# Patient Record
Sex: Female | Born: 1970
Health system: Southern US, Community
[De-identification: ages and names within clinical notes are randomized; demographics above are authoritative.]

## PROBLEM LIST (undated history)

## (undated) DIAGNOSIS — I10 Essential (primary) hypertension: Secondary | ICD-10-CM

## (undated) HISTORY — PX: TUBAL LIGATION: SHX77

---

## 2008-08-02 ENCOUNTER — Emergency Department (HOSPITAL_COMMUNITY): Admission: EM | Admit: 2008-08-02 | Discharge: 2008-08-02 | Payer: Self-pay | Admitting: Family Medicine

## 2008-08-25 ENCOUNTER — Encounter: Admission: RE | Admit: 2008-08-25 | Discharge: 2008-08-25 | Payer: Self-pay | Admitting: Family Medicine

## 2008-11-20 ENCOUNTER — Ambulatory Visit: Payer: Self-pay | Admitting: Radiology

## 2008-11-20 ENCOUNTER — Emergency Department (HOSPITAL_BASED_OUTPATIENT_CLINIC_OR_DEPARTMENT_OTHER): Admission: EM | Admit: 2008-11-20 | Discharge: 2008-11-20 | Payer: Self-pay | Admitting: Emergency Medicine

## 2009-08-17 ENCOUNTER — Emergency Department (HOSPITAL_BASED_OUTPATIENT_CLINIC_OR_DEPARTMENT_OTHER): Admission: EM | Admit: 2009-08-17 | Discharge: 2009-08-17 | Payer: Self-pay | Admitting: Emergency Medicine

## 2009-08-17 ENCOUNTER — Ambulatory Visit: Payer: Self-pay | Admitting: Diagnostic Radiology

## 2010-06-11 LAB — URINALYSIS, ROUTINE W REFLEX MICROSCOPIC
Bilirubin Urine: NEGATIVE
Protein, ur: NEGATIVE mg/dL
Specific Gravity, Urine: 1.021 (ref 1.005–1.030)
pH: 8 (ref 5.0–8.0)

## 2010-06-11 LAB — BASIC METABOLIC PANEL
CO2: 27 mEq/L (ref 19–32)
Calcium: 8.9 mg/dL (ref 8.4–10.5)
Chloride: 106 mEq/L (ref 96–112)
Creatinine, Ser: 0.7 mg/dL (ref 0.4–1.2)
Glucose, Bld: 98 mg/dL (ref 70–99)

## 2010-06-11 LAB — DIFFERENTIAL
Basophils Absolute: 0.1 10*3/uL (ref 0.0–0.1)
Basophils Relative: 1 % (ref 0–1)
Eosinophils Absolute: 0 10*3/uL (ref 0.0–0.7)
Eosinophils Relative: 1 % (ref 0–5)
Lymphs Abs: 1.2 10*3/uL (ref 0.7–4.0)
Monocytes Absolute: 0.4 10*3/uL (ref 0.1–1.0)
Monocytes Relative: 5 % (ref 3–12)

## 2010-06-11 LAB — CBC
Hemoglobin: 11.5 g/dL — ABNORMAL LOW (ref 12.0–15.0)
MCHC: 34.2 g/dL (ref 30.0–36.0)
RBC: 3.92 MIL/uL (ref 3.87–5.11)
WBC: 7.9 10*3/uL (ref 4.0–10.5)

## 2010-06-11 LAB — WET PREP, GENITAL
Trich, Wet Prep: NONE SEEN
Yeast Wet Prep HPF POC: NONE SEEN

## 2010-06-11 LAB — PREGNANCY, URINE: Preg Test, Ur: NEGATIVE

## 2010-06-30 LAB — COMPREHENSIVE METABOLIC PANEL
ALT: 6 U/L (ref 0–35)
AST: 26 U/L (ref 0–37)
Albumin: 4.2 g/dL (ref 3.5–5.2)
Alkaline Phosphatase: 79 U/L (ref 39–117)
BUN: 8 mg/dL (ref 6–23)
CO2: 32 mEq/L (ref 19–32)
Creatinine, Ser: 0.7 mg/dL (ref 0.4–1.2)
GFR calc non Af Amer: >60 mL/min (ref >60)
Glucose, Bld: 94 mg/dL (ref 70–99)
Total Bilirubin: 0.2 mg/dL — ABNORMAL LOW (ref 0.3–1.2)
Total Protein: 7.9 g/dL (ref 6.0–8.3)

## 2010-06-30 LAB — WET PREP, GENITAL

## 2010-06-30 LAB — URINALYSIS, ROUTINE W REFLEX MICROSCOPIC
Glucose, UA: NEGATIVE mg/dL
Ketones, ur: NEGATIVE mg/dL
Nitrite: NEGATIVE
Protein, ur: NEGATIVE mg/dL
Urobilinogen, UA: 0.2 mg/dL (ref 0.0–1.0)
pH: 7 (ref 5.0–8.0)

## 2010-06-30 LAB — CBC
MCHC: 33.7 g/dL (ref 30.0–36.0)
Platelets: 364 10*3/uL (ref 150–400)
WBC: 6 10*3/uL (ref 4.0–10.5)

## 2010-06-30 LAB — GC/CHLAMYDIA PROBE AMP, GENITAL: Chlamydia, DNA Probe: NEGATIVE

## 2010-06-30 LAB — DIFFERENTIAL
Eosinophils Absolute: 0.1 10*3/uL (ref 0.0–0.7)
Eosinophils Relative: 1 % (ref 0–5)
Lymphocytes Relative: 29 % (ref 12–46)
Monocytes Relative: 6 % (ref 3–12)

## 2010-06-30 LAB — PREGNANCY, URINE: Preg Test, Ur: NEGATIVE

## 2010-07-21 ENCOUNTER — Emergency Department (HOSPITAL_COMMUNITY)
Admission: EM | Admit: 2010-07-21 | Discharge: 2010-07-21 | Disposition: A | Discharge status: Home / Self Care | Payer: Self-pay | Attending: Emergency Medicine | Admitting: Emergency Medicine

## 2010-07-21 DIAGNOSIS — I10 Essential (primary) hypertension: Secondary | ICD-10-CM | POA: Insufficient documentation

## 2010-07-21 DIAGNOSIS — J329 Chronic sinusitis, unspecified: Secondary | ICD-10-CM | POA: Insufficient documentation

## 2010-07-21 DIAGNOSIS — M436 Torticollis: Secondary | ICD-10-CM | POA: Insufficient documentation

## 2010-07-21 DIAGNOSIS — M542 Cervicalgia: Secondary | ICD-10-CM | POA: Insufficient documentation

## 2010-12-12 ENCOUNTER — Emergency Department (HOSPITAL_BASED_OUTPATIENT_CLINIC_OR_DEPARTMENT_OTHER)
Admission: EM | Admit: 2010-12-12 | Discharge: 2010-12-12 | Disposition: A | Discharge status: Home / Self Care | Payer: Self-pay | Attending: Emergency Medicine | Admitting: Emergency Medicine

## 2010-12-12 ENCOUNTER — Encounter: Payer: Self-pay | Admitting: *Deleted

## 2010-12-12 ENCOUNTER — Emergency Department (INDEPENDENT_AMBULATORY_CARE_PROVIDER_SITE_OTHER): Payer: Self-pay

## 2010-12-12 ENCOUNTER — Other Ambulatory Visit: Payer: Self-pay

## 2010-12-12 DIAGNOSIS — I10 Essential (primary) hypertension: Secondary | ICD-10-CM | POA: Insufficient documentation

## 2010-12-12 DIAGNOSIS — R002 Palpitations: Secondary | ICD-10-CM | POA: Insufficient documentation

## 2010-12-12 HISTORY — DX: Essential (primary) hypertension: I10

## 2010-12-12 LAB — BASIC METABOLIC PANEL
Chloride: 102 mEq/L (ref 96–112)
GFR calc Af Amer: >60 mL/min (ref >60)
GFR calc non Af Amer: >60 mL/min (ref >60)
Potassium: 3.5 mEq/L (ref 3.5–5.1)
Sodium: 137 mEq/L (ref 135–145)

## 2010-12-12 LAB — CBC
MCHC: 34.7 g/dL (ref 30.0–36.0)
RDW: 12.6 % (ref 11.5–15.5)
WBC: 6.4 10*3/uL (ref 4.0–10.5)

## 2010-12-12 LAB — CARDIAC PANEL(CRET KIN+CKTOT+MB+TROPI)
CK, MB: 2.9 ng/mL (ref 0.3–4.0)
Relative Index: 1 (ref 0.0–2.5)
Total CK: 281 U/L — ABNORMAL HIGH (ref 7–177)
Troponin I: <0.3 ng/mL (ref <0.30)

## 2010-12-12 MED ORDER — ATENOLOL 25 MG PO TABS: 25.0000 mg | ORAL_TABLET | Freq: Every day | ORAL | Status: DC

## 2010-12-12 NOTE — ED Notes (Signed)
Pt remains SR on monitor without ectopy, resps even and unlabored, IV site unremarkable. Plan of care discussed with pt, no complaints noted or voiced.

## 2010-12-12 NOTE — ED Provider Notes (Signed)
History     CSN: [id number] Arrival date & time: 12/12/2010  1:36 AM   Chief Complaint  Patient presents with  . Palpitations     (Include location/radiation/quality/duration/timing/severity/associated sxs/prior treatment) Patient is a 40 y.o. female presenting with palpitations. The history is provided by the patient.  Palpitations  This is a chronic problem. Pertinent negatives include no fever, no chest pain, no abdominal pain, no headaches, no back pain and no shortness of breath.   symptoms are described as a tickling sensation in her chest. Symptoms have been on and off for years. More recently her symptoms have becoming more frequent. She has also noticed some dry cough. She also admits to increased stress at work over the last week. Patient has been taking lisinopril HCTZ for some time now and she read about symptoms of ACE inhibitor cough. So, she stopped taking this medication about a week ago. Her cough and palpitations are unchanged. She denies any fevers or chills, any nausea or vomiting, or any chest pain or shortness of breath. She does have symptoms tonight and every few minutes. No recent illness. No history of thyroid problems. No sick contacts. No recent travel. No rash. No pain or radiation. Severity is moderate. No prior treatment.   Past Medical History  Diagnosis Date  . Hypertension      Past Surgical History  Procedure Date  . Tubal ligation     History reviewed. No pertinent family history.  History  Substance Use Topics  . Smoking status: Never Smoker   . Smokeless tobacco: Not on file  . Alcohol Use: Yes    OB History    Grav Para Term Preterm Abortions TAB SAB Ect Mult Living                  Review of Systems  Constitutional: Negative for fever and chills.  HENT: Negative for neck pain and neck stiffness.   Eyes: Negative for pain.  Respiratory: Negative for shortness of breath.   Cardiovascular: Positive for palpitations. Negative for  chest pain and leg swelling.  Gastrointestinal: Negative for abdominal pain.  Genitourinary: Negative for dysuria and difficulty urinating.  Musculoskeletal: Negative for back pain.  Skin: Negative for rash.  Neurological: Negative for syncope and headaches.  All other systems reviewed and are negative.    Allergies  Shellfish allergy  Home Medications   Current Outpatient Rx  Name Route Sig Dispense Refill  . ASPIRIN 81 MG PO CHEW Oral Chew 324 mg by mouth once.      Marland Kitchen LISINOPRIL 10 MG PO TABS Oral Take 10 mg by mouth daily.        Physical Exam    BP 121/69  Temp(Src) 98.2 F (36.8 C) (Oral)  Resp 18  SpO2 100%  Physical Exam  Constitutional: She is oriented to person, place, and time. She appears well-developed and well-nourished.  HENT:  Head: Normocephalic and atraumatic.  Eyes: Conjunctivae and EOM are normal. Pupils are equal, round, and reactive to light.  Neck: Trachea normal. Neck supple. No thyromegaly present.  Cardiovascular: Normal rate, regular rhythm, S1 normal, S2 normal and normal pulses.     No systolic murmur is present   No diastolic murmur is present  Pulses:      Radial pulses are 2+ on the right side, and 2+ on the left side.  Pulmonary/Chest: Effort normal and breath sounds normal. She has no wheezes. She has no rhonchi. She has no rales. She exhibits no tenderness.  Abdominal: Soft. Normal appearance and bowel sounds are normal. There is no tenderness. There is no CVA tenderness and negative Murphy's sign.  Musculoskeletal:       BLE:s Calves nontender, no cords or erythema, negative Homans sign  Neurological: She is alert and oriented to person, place, and time. She has normal strength. No cranial nerve deficit or sensory deficit. GCS eye subscore is 4. GCS verbal subscore is 5. GCS motor subscore is 6.  Skin: Skin is warm and dry. No rash noted. She is not diaphoretic.  Psychiatric: Her speech is normal.       Cooperative and appropriate     ED Course  Procedures  Results for orders placed during the hospital encounter of 12/12/10  CBC      Component Value Range   WBC 6.4  4.0 - 10.5 (K/uL)   RBC 4.14  3.87 - 5.11 (MIL/uL)   Hemoglobin 11.9 (*) 12.0 - 15.0 (g/dL)   HCT 16.1 (*) 09.6 - 46.0 (%)   MCV 82.9  78.0 - 100.0 (fL)   MCH 28.7  26.0 - 34.0 (pg)   MCHC 34.7  30.0 - 36.0 (g/dL)   RDW 04.5  40.9 - 81.1 (%)   Platelets 310  150 - 400 (K/uL)  BASIC METABOLIC PANEL      Component Value Range   Sodium 137  135 - 145 (mEq/L)   Potassium 3.5  3.5 - 5.1 (mEq/L)   Chloride 102  96 - 112 (mEq/L)   CO2 25  19 - 32 (mEq/L)   Glucose, Bld 100 (*) 70 - 99 (mg/dL)   BUN 10  6 - 23 (mg/dL)   Creatinine, Ser 9.14  0.50 - 1.10 (mg/dL)   Calcium 9.6  8.4 - 78.2 (mg/dL)   GFR calc non Af Amer >60  >60 (mL/min)   GFR calc Af Amer >60  >60 (mL/min)  CARDIAC PANEL(CRET KIN+CKTOT+MB+TROPI)      Component Value Range   Total CK 281 (*) 7 - 177 (U/L)   CK, MB 2.9  0.3 - 4.0 (ng/mL)   Troponin I <0.30  <0.30 (ng/mL)   Relative Index 1.0  0.0 - 2.5    Dg Chest 2 View  12/12/2010  *RADIOLOGY REPORT*  Clinical Data: Heart palpitations  CHEST - 2 VIEW  Comparison: 08/25/2008  Findings: Lungs are clear. No pleural effusion or pneumothorax. The cardiomediastinal contours are within normal limits. The visualized bones and soft tissues are without significant appreciable abnormality.  IMPRESSION: No acute cardiopulmonary process.  Original Report Authenticated By: Waneta Martins, M.D.     Date: 12/12/2010  Rate: 71  Rhythm: normal sinus rhythm  QRS Axis: normal  Intervals: normal  ST/T Wave abnormalities: nonspecific ST changes  Conduction Disutrbances:none  Narrative Interpretation:   Old EKG Reviewed: none available    MDM Adult female with palpitations and history of hypertension. She is followed by Dr. Luz Brazen at [facility]. She agrees to follow up with her physician for recheck and Holter monitor. She is also  agreeable to starting a beta blocker as she stopped taking her lisinopril HCTZ for fear of possible ACE inhibitor cough. Her workup today including EKG, chest x-ray, labs did not reveal an etiology for her symptoms. She is low risk for ACS and I doubt the same. She also agrees to followup with Dr. Luz Brazen for her TSH results. That lab was sent today but she understands that those results will not be available immediately. On the cardiac monitor in  the emergency department no PVCs or arrhythmia were noted.       Sunnie Nielsen, MD 12/12/10 [phone]

## 2010-12-12 NOTE — ED Notes (Signed)
Pt to radiology, NAD noted. 

## 2010-12-12 NOTE — ED Notes (Signed)
Pt c/o palpitations since yesterday, has recent increased stress in life. Denies any n/v, no diaphoresis.

## 2010-12-12 NOTE — ED Notes (Signed)
Pt c/o palpitations for past week with chest tightness. Pt has stopped taking BP med x 1 week. Pt states that she has had increased stress recently which may be contributing to her problem.

## 2011-11-18 ENCOUNTER — Emergency Department (INDEPENDENT_AMBULATORY_CARE_PROVIDER_SITE_OTHER)
Admission: EM | Admit: 2011-11-18 | Discharge: 2011-11-18 | Disposition: A | Discharge status: Home / Self Care | Payer: Self-pay | Source: Home / Self Care | Attending: Family Medicine | Admitting: Family Medicine

## 2011-11-18 ENCOUNTER — Emergency Department (HOSPITAL_COMMUNITY): Admission: EM | Admit: 2011-11-18 | Discharge: 2011-11-18 | Discharge status: Absent without leave | Payer: Self-pay

## 2011-11-18 ENCOUNTER — Encounter (HOSPITAL_COMMUNITY): Payer: Self-pay | Admitting: *Deleted

## 2011-11-18 DIAGNOSIS — L239 Allergic contact dermatitis, unspecified cause: Secondary | ICD-10-CM

## 2011-11-18 DIAGNOSIS — L259 Unspecified contact dermatitis, unspecified cause: Secondary | ICD-10-CM

## 2011-11-18 MED ORDER — PREDNISONE 10 MG PO TABS: ORAL_TABLET | ORAL | Status: DC

## 2011-11-18 NOTE — ED Provider Notes (Signed)
History     CSN: [id number]  Arrival date & time 11/18/11  1649   First MD Initiated Contact with Patient 11/18/11 1917      Chief Complaint  Patient presents with  . Rash    (Consider location/radiation/quality/duration/timing/severity/associated sxs/prior treatment) Patient is a 41 y.o. female presenting with rash. The history is provided by the patient. No language interpreter was used.  Rash  This is a new problem. Episode onset: 3 weeks. The problem has been gradually worsening. The problem is associated with nothing. There has been no fever. The rash is present on the abdomen and trunk. The pain is at a severity of 5/10. The pain is moderate. Associated symptoms include blisters and itching. She has tried nothing for the symptoms. The treatment provided no relief.  Pt complains of a rash for several weeks.  Pt complains of chronic skin problems.  Pt used to see dermatologist but can not afford now  Past Medical History  Diagnosis Date  . Hypertension     Past Surgical History  Procedure Date  . Tubal ligation     History reviewed. No pertinent family history.  History  Substance Use Topics  . Smoking status: Never Smoker   . Smokeless tobacco: Not on file  . Alcohol Use: Yes    OB History    Grav Para Term Preterm Abortions TAB SAB Ect Mult Living                  Review of Systems  Skin: Positive for itching and rash.  All other systems reviewed and are negative.    Allergies  Shellfish allergy  Home Medications   Current Outpatient Rx  Name Route Sig Dispense Refill  . ASPIRIN 81 MG PO CHEW Oral Chew 324 mg by mouth once.      . ATENOLOL 25 MG PO TABS Oral Take 1 tablet (25 mg total) by mouth daily. 30 tablet 0  . LISINOPRIL 10 MG PO TABS Oral Take 10 mg by mouth daily.        BP 147/94  Pulse 69  Temp 98.5 F (36.9 C) (Oral)  Resp 16  SpO2 99%  LMP 11/14/2011  Physical Exam  Nursing note and vitals reviewed. Constitutional: She is  oriented to person, place, and time. She appears well-developed and well-nourished.  HENT:  Head: Normocephalic.  Eyes: EOM are normal.  Neck: Normal range of motion.  Pulmonary/Chest: Effort normal.  Abdominal: She exhibits no distension.  Musculoskeletal: Normal range of motion.  Neurological: She is alert and oriented to person, place, and time.  Skin: Rash noted.       Fine raised rash,  Multiple dark scarred areas from pimples  Psychiatric: She has a normal mood and affect.    ED Course  Procedures (including critical care time)  Labs Reviewed - No data to display No results found.   1. Allergic dermatitis       MDM  Rash does not look like scabies,  I suspect allergic.  I will treat with prednisone.          Lonia Skinner [place], [place] 11/18/11 1921  Lonia Skinner [place], [place] 11/18/11 Ernestina [place]

## 2011-11-18 NOTE — ED Notes (Signed)
Pt  Reports  Symptoms  Of  A  Fine  Rash  That  Itches  Started off   Upper  Torso/ back    Spread  To upper thighs  As  Well -  Pt   denys  Any  New  meds  Or  any known causative  Agents     Pt  Is   In no  Acute   Distress

## 2011-11-19 NOTE — ED Provider Notes (Signed)
Medical screening examination/treatment/procedure(s) were performed by non-physician practitioner and as supervising physician I was immediately available for consultation/collaboration.   MORENO-COLL,Yaritzel Stange; MD   Kaziyah Parkison Moreno-Coll, MD 11/19/11 0316 

## 2012-01-20 ENCOUNTER — Emergency Department (HOSPITAL_COMMUNITY)
Admission: EM | Admit: 2012-01-20 | Discharge: 2012-01-20 | Disposition: A | Discharge status: Home / Self Care | Payer: Self-pay | Source: Home / Self Care | Attending: Family Medicine | Admitting: Family Medicine

## 2012-01-20 ENCOUNTER — Encounter (HOSPITAL_COMMUNITY): Payer: Self-pay | Admitting: Emergency Medicine

## 2012-01-20 DIAGNOSIS — J069 Acute upper respiratory infection, unspecified: Secondary | ICD-10-CM

## 2012-01-20 MED ORDER — FLUTICASONE PROPIONATE 50 MCG/ACT NA SUSP: 2.0000 | Freq: Every day | NASAL | Status: DC

## 2012-01-20 MED ORDER — NAPROXEN 375 MG PO TABS: 375.0000 mg | ORAL_TABLET | Freq: Two times a day (BID) | ORAL | Status: DC

## 2012-01-20 MED ORDER — CETIRIZINE HCL 10 MG PO TABS: 10.0000 mg | ORAL_TABLET | Freq: Every day | ORAL | Status: DC

## 2012-01-20 MED ORDER — SALINE NASAL SPRAY 0.65 % NA SOLN: 1.0000 | NASAL | Status: DC | PRN

## 2012-01-20 NOTE — ED Notes (Signed)
Patient placed in gown, given warm blankets

## 2012-01-20 NOTE — ED Provider Notes (Signed)
History     CSN: [id number]  Arrival date & time 01/20/12  1325   None     Chief Complaint  Patient presents with  . Sore Throat    (Consider location/radiation/quality/duration/timing/severity/associated sxs/prior treatment) HPI Barbara Fox is a 41 y.o. female who complains of onset of cold symptoms for 7 days.  Reports to have noticed a foul odor while brushing her teeth Friday and loss her voice the next day. 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 No OTC taken for cold sx like. + sore throat + cough, non productive No pleuritic pain No wheezing No nasal congestion + post-nasal drainage + sinus pain/pressure + voice changes No chest congestion No itchy/red eyes + earache No hemoptysis No SOB No chills/sweats + fever ( today at 9:00 :102.94F and 12:00: 99.78F) No nausea No vomiting No abdominal pain No diarrhea No skin rashes No fatigue No myalgias + cervicalgia No paresthesia to UE + headache (rubber band sensation-frontotemporal lobe) No ill contacts    Past Medical History  Diagnosis Date  . Hypertension     Past Surgical History  Procedure Date  . Tubal ligation     No family history on file.  History  Substance Use Topics  . Smoking status: Never Smoker   . Smokeless tobacco: Not on file  . Alcohol Use: Yes    OB History    Grav Para Term Preterm Abortions TAB SAB Ect Mult Living                  Review of Systems  Constitutional: Positive for fever. Negative for chills and appetite change.  HENT: Positive for ear pain, sore throat, rhinorrhea, trouble swallowing, neck pain and voice change. Negative for drooling, postnasal drip, sinus pressure and ear discharge.   Eyes: Negative.   Respiratory: Negative.   Cardiovascular: Negative.   Gastrointestinal: Negative.  Negative for nausea, vomiting and diarrhea.  Skin: Negative.     Allergies  Shellfish allergy  Home Medications   Current Outpatient Rx  Name Route Sig  Dispense Refill  . NAPROXEN SODIUM 220 MG PO TABS Oral Take 220 mg by mouth 2 (two) times daily with a meal.    . OVER THE COUNTER MEDICATION  Cough drop    . ASPIRIN 81 MG PO CHEW Oral Chew 324 mg by mouth once.      . ATENOLOL 25 MG PO TABS Oral Take 1 tablet (25 mg total) by mouth daily. 30 tablet 0  . CETIRIZINE HCL 10 MG PO TABS Oral Take 1 tablet (10 mg total) by mouth daily. 30 tablet 1  . FLUTICASONE PROPIONATE 50 MCG/ACT NA SUSP Nasal Place 2 sprays into the nose daily. 16 g 2  . LISINOPRIL 10 MG PO TABS Oral Take 10 mg by mouth daily.      Marland Kitchen NAPROXEN 375 MG PO TABS Oral Take 1 tablet (375 mg total) by mouth 2 (two) times daily. 60 tablet 0  . PREDNISONE 10 MG PO TABS  6,5,4,3,2,1 taper 21 tablet 0  . SALINE NASAL SPRAY 0.65 % NA SOLN Nasal Place 1 spray into the nose as needed for congestion. 30 mL 12    BP 139/81  Pulse 65  Temp 97.8 F (36.6 C) (Oral)  Resp 16  SpO2 100%  LMP 01/15/2012  Physical Exam  Nursing note and vitals reviewed. Constitutional: She is oriented to person, place, and time. Vital signs are normal. She appears well-developed and well-nourished. She is active and  cooperative.  HENT:  Head: Normocephalic and atraumatic.  Right Ear: Hearing, tympanic membrane, external ear and ear canal normal.  Left Ear: Hearing, tympanic membrane, external ear and ear canal normal.  Nose: Nose normal. Right sinus exhibits no maxillary sinus tenderness and no frontal sinus tenderness. Left sinus exhibits no maxillary sinus tenderness and no frontal sinus tenderness.  Mouth/Throat: Uvula is midline and mucous membranes are normal. Posterior oropharyngeal erythema present. No oropharyngeal exudate or tonsillar abscesses.    Eyes: Conjunctivae normal are normal. Pupils are equal, round, and reactive to light. Right eye exhibits no discharge. Left eye exhibits no discharge. No scleral icterus.  Neck: Normal range of motion, full passive range of motion without pain and  phonation normal. Neck supple. No mass and no thyromegaly present.  Cardiovascular: Normal rate, regular rhythm, normal heart sounds and normal pulses.   Pulmonary/Chest: Effort normal and breath sounds normal.  Abdominal: Soft. Normal appearance and bowel sounds are normal. There is no tenderness.  Musculoskeletal: Normal range of motion.  Lymphadenopathy:       Head (right side): No submental, no submandibular, no tonsillar, no preauricular, no posterior auricular and no occipital adenopathy present.       Head (left side): No submental, no submandibular, no tonsillar, no preauricular, no posterior auricular and no occipital adenopathy present.    She has no cervical adenopathy.  Neurological: She is alert and oriented to person, place, and time. She has normal strength and normal reflexes. No sensory deficit. GCS eye subscore is 4. GCS verbal subscore is 5. GCS motor subscore is 6.  Skin: Skin is warm, dry and intact.  Psychiatric: She has a normal mood and affect. Her speech is normal and behavior is normal. Judgment and thought content normal. Cognition and memory are normal.    ED Course  Procedures (including critical care time)   Labs Reviewed  POCT RAPID STREP A ([facility] ONLY)   No results found.   1. URI (upper respiratory infection)       MDM  Increase fluid intake, rest. Salt water gargles may relieve the sore throat.  Begin expectorant/decongestant, saline nasal spray and/or saline irrigation. Antihistamines of your choice (Claritin or Zyrtec).  Tylenol or Motrin for fever/discomfort.  Followup with PCP if not improving 7 to 10 days.        Johnsie Kindred, NP 01/20/12 1752

## 2012-01-20 NOTE — ED Notes (Signed)
Instructions not available 

## 2012-01-20 NOTE — ED Notes (Signed)
Sore throat, stiff neck, runny nose, headache, and ringing in ears for 3 days.  Patient reports if she pushes on throat, she feels soreness.  Reports 102 temp this am.

## 2012-01-21 NOTE — ED Provider Notes (Signed)
Medical screening examination/treatment/procedure(s) were performed by resident physician or non-physician practitioner and as supervising physician I was immediately available for consultation/collaboration.   Barkley Bruns MD.    Linna Hoff, MD 01/21/12 [phone]

## 2015-04-11 ENCOUNTER — Ambulatory Visit: Payer: PRIVATE HEALTH INSURANCE | Admitting: Family Medicine

## 2015-04-11 ENCOUNTER — Telehealth: Payer: Self-pay | Admitting: Family Medicine

## 2015-04-11 ENCOUNTER — Ambulatory Visit (INDEPENDENT_AMBULATORY_CARE_PROVIDER_SITE_OTHER): Payer: PRIVATE HEALTH INSURANCE | Admitting: Family Medicine

## 2015-04-11 ENCOUNTER — Telehealth: Payer: Self-pay | Admitting: Medical

## 2015-04-11 DIAGNOSIS — I10 Essential (primary) hypertension: Secondary | ICD-10-CM

## 2015-04-11 NOTE — Progress Notes (Signed)
Patient ID: Barbara Fox, female    DOB: [birth year]  Age: 45 y.o. MRN: [id number]    Subjective:    Review of Systems  History Past Medical History  Diagnosis Date  . Hypertension     She has past surgical history that includes Tubal ligation.   Her family history is not on file.She reports that she has never smoked. She does not have any smokeless tobacco history on file. She reports that she drinks alcohol. She reports that she does not use illicit drugs.   Objective:  Objective Physical Exam There were no vitals taken for this visit.

## 2015-04-12 ENCOUNTER — Telehealth: Payer: Self-pay | Admitting: Internal Medicine

## 2015-04-12 ENCOUNTER — Encounter: Payer: PRIVATE HEALTH INSURANCE | Admitting: Medical

## 2015-04-12 NOTE — Telephone Encounter (Signed)
Marked to charge and mailing letter °

## 2015-04-12 NOTE — Telephone Encounter (Signed)
Error

## 2015-04-12 NOTE — Telephone Encounter (Signed)
-----   Message from Elliot Gault sent at 04/11/2015  7:00 PM EST -----   ----- Message -----    From: Lelon Perla, DO    Sent: 04/11/2015   6:26 PM      To: Elliot Gault  charge

## 2015-04-12 NOTE — Progress Notes (Signed)
This encounter was created in error - please disregard.

## 2015-04-14 NOTE — Telephone Encounter (Signed)
I WOULD LIKE SOMEONE TO CALL PATIENT

## 2015-04-14 NOTE — Telephone Encounter (Signed)
Looks like pt has had 2 no shows. Dr. Laury Axon wants someone to call pt. Looks like she is new pt. See why she keeps no showing. Is she doing to come in?

## 2015-04-14 NOTE — Telephone Encounter (Signed)
Pt was no show 04/12/15 10:30am for acute appt, pt did not reschedule, charge or no charge?  Dr. Laury Axon - this pt was scheduled with you 04/11/15 and no showed (charged) and has not had new pt appt yet. New Pt scheduled for 07/13/15. Do you want to keep it on the schedule?

## 2015-04-17 NOTE — Telephone Encounter (Signed)
Left message for pt to call back to schedule an appointment for a blood pressure follow up.  No charge for 04/12/15 per ES.

## 2015-04-18 NOTE — Telephone Encounter (Signed)
Message was left for pt on 04/17/15 to call back to set up an appointment from front desk.

## 2015-04-19 ENCOUNTER — Telehealth: Payer: Self-pay | Admitting: Internal Medicine

## 2015-04-19 NOTE — Telephone Encounter (Signed)
She no showed her apt, she needs to be seen before we can fill. She can have an acute for a BP check and will have to keep her Est Care apt or she can Est tomorrow at 3:30 since we have a cancellation.    KP

## 2015-04-19 NOTE — Telephone Encounter (Signed)
Called patient an offered her appt tomorrow with Dr. Laury Axon. Patient declined and stated that she will look for another PCP

## 2015-04-19 NOTE — Telephone Encounter (Signed)
Relation to ZO:XWRU Call back number:[phone] Pharmacy: [facility] [address] [place], [place] [address] [phone]    Reason for call:  Patient has a new patient appointment 07/13/15 and completely out of her lisinopril (PRINIVIL,ZESTRIL) 10 MG tablet and would like to know if she should schedule an appointment or would PCP prescribe. Please advise

## 2015-04-19 NOTE — Telephone Encounter (Signed)
I probably would have filled until appointment but that is ok

## 2015-04-19 NOTE — Telephone Encounter (Signed)
Noted I will make Dr.Lowne aware.    KP

## 2015-04-19 NOTE — Telephone Encounter (Signed)
noted 

## 2015-07-12 ENCOUNTER — Telehealth: Payer: Self-pay | Admitting: Behavioral Health

## 2015-07-12 NOTE — Telephone Encounter (Signed)
Unable to reach patient at time of Pre-Visit Call.  Left message for patient to return call when available.    

## 2015-07-13 ENCOUNTER — Ambulatory Visit: Payer: PRIVATE HEALTH INSURANCE | Admitting: Family Medicine

## 2015-07-13 DIAGNOSIS — Z0289 Encounter for other administrative examinations: Secondary | ICD-10-CM

## 2015-07-14 ENCOUNTER — Telehealth: Payer: Self-pay | Admitting: Family Medicine

## 2015-07-14 NOTE — Telephone Encounter (Signed)
Pt was no show 07/13/15 1:30pm for new pt appt, this is 3rd no show and 1 cancellation, marked chart not to schedule pt with LBSW per policy, charge or no charge?

## 2015-07-14 NOTE — Telephone Encounter (Signed)
Charge and do not schedule

## 2015-07-17 ENCOUNTER — Encounter: Payer: Self-pay | Admitting: Family Medicine

## 2015-07-17 NOTE — Telephone Encounter (Signed)
Marked to charge and mailing no show letter °

## 2015-10-18 ENCOUNTER — Emergency Department (HOSPITAL_BASED_OUTPATIENT_CLINIC_OR_DEPARTMENT_OTHER)
Admission: EM | Admit: 2015-10-18 | Discharge: 2015-10-18 | Disposition: A | Discharge status: Home / Self Care | Payer: PRIVATE HEALTH INSURANCE | Attending: Emergency Medicine | Admitting: Emergency Medicine

## 2015-10-18 ENCOUNTER — Encounter (HOSPITAL_BASED_OUTPATIENT_CLINIC_OR_DEPARTMENT_OTHER): Payer: Self-pay | Admitting: *Deleted

## 2015-10-18 DIAGNOSIS — S161XXA Strain of muscle, fascia and tendon at neck level, initial encounter: Secondary | ICD-10-CM | POA: Insufficient documentation

## 2015-10-18 DIAGNOSIS — X58XXXA Exposure to other specified factors, initial encounter: Secondary | ICD-10-CM | POA: Insufficient documentation

## 2015-10-18 DIAGNOSIS — I10 Essential (primary) hypertension: Secondary | ICD-10-CM | POA: Insufficient documentation

## 2015-10-18 DIAGNOSIS — H65191 Other acute nonsuppurative otitis media, right ear: Secondary | ICD-10-CM | POA: Insufficient documentation

## 2015-10-18 DIAGNOSIS — J069 Acute upper respiratory infection, unspecified: Secondary | ICD-10-CM

## 2015-10-18 DIAGNOSIS — Y999 Unspecified external cause status: Secondary | ICD-10-CM | POA: Insufficient documentation

## 2015-10-18 DIAGNOSIS — Y939 Activity, unspecified: Secondary | ICD-10-CM | POA: Insufficient documentation

## 2015-10-18 DIAGNOSIS — Z79899 Other long term (current) drug therapy: Secondary | ICD-10-CM | POA: Insufficient documentation

## 2015-10-18 DIAGNOSIS — Y929 Unspecified place or not applicable: Secondary | ICD-10-CM | POA: Insufficient documentation

## 2015-10-18 LAB — CBC WITH DIFFERENTIAL/PLATELET
BASOS ABS: 0 10*3/uL (ref 0.0–0.1)
BASOS PCT: 0 %
EOS ABS: 0.2 10*3/uL (ref 0.0–0.7)
EOS PCT: 3 %
HEMATOCRIT: 34 % — AB (ref 36.0–46.0)
Hemoglobin: 11.6 g/dL — ABNORMAL LOW (ref 12.0–15.0)
Lymphocytes Relative: 33 %
Lymphs Abs: 2 10*3/uL (ref 0.7–4.0)
MCH: 28.4 pg (ref 26.0–34.0)
MCHC: 34.1 g/dL (ref 30.0–36.0)
MCV: 83.3 fL (ref 78.0–100.0)
MONO ABS: 0.4 10*3/uL (ref 0.1–1.0)
Monocytes Relative: 7 %
NEUTROS ABS: 3.3 10*3/uL (ref 1.7–7.7)
Neutrophils Relative %: 57 %
PLATELETS: 393 10*3/uL (ref 150–400)
RBC: 4.08 MIL/uL (ref 3.87–5.11)
RDW: 13.1 % (ref 11.5–15.5)
WBC: 5.9 10*3/uL (ref 4.0–10.5)

## 2015-10-18 LAB — BASIC METABOLIC PANEL
ANION GAP: 7 (ref 5–15)
BUN: 9 mg/dL (ref 6–20)
CALCIUM: 8.6 mg/dL — AB (ref 8.9–10.3)
CO2: 26 mmol/L (ref 22–32)
Chloride: 103 mmol/L (ref 101–111)
Creatinine, Ser: 0.83 mg/dL (ref 0.44–1.00)
GFR calc Af Amer: >60 mL/min (ref >60)
GLUCOSE: 126 mg/dL — AB (ref 65–99)
Potassium: 3.3 mmol/L — ABNORMAL LOW (ref 3.5–5.1)
Sodium: 136 mmol/L (ref 135–145)

## 2015-10-18 LAB — URINE MICROSCOPIC-ADD ON

## 2015-10-18 LAB — CSF CELL COUNT WITH DIFFERENTIAL
RBC Count, CSF: 0 /mm3
RBC Count, CSF: 72 /mm3 — ABNORMAL HIGH
TUBE #: 1
TUBE #: 4
WBC, CSF: 5 /mm3 (ref 0–5)
WBC, CSF: 6 /mm3 — ABNORMAL HIGH (ref 0–5)

## 2015-10-18 LAB — URINALYSIS, ROUTINE W REFLEX MICROSCOPIC
Bilirubin Urine: NEGATIVE
GLUCOSE, UA: NEGATIVE mg/dL
KETONES UR: NEGATIVE mg/dL
NITRITE: NEGATIVE
PROTEIN: NEGATIVE mg/dL
Specific Gravity, Urine: 1.012 (ref 1.005–1.030)
pH: 6 (ref 5.0–8.0)

## 2015-10-18 LAB — PROTEIN, CSF: Total  Protein, CSF: 32 mg/dL (ref 15–45)

## 2015-10-18 LAB — GLUCOSE, CSF: GLUCOSE CSF: 63 mg/dL (ref 40–70)

## 2015-10-18 LAB — RAPID STREP SCREEN (MED CTR MEBANE ONLY): STREPTOCOCCUS, GROUP A SCREEN (DIRECT): NEGATIVE

## 2015-10-18 LAB — PREGNANCY, URINE: PREG TEST UR: NEGATIVE

## 2015-10-18 MED ORDER — METHOCARBAMOL 1000 MG/10ML IJ SOLN
INTRAMUSCULAR | Status: AC
  Filled 2015-10-18: qty 10

## 2015-10-18 MED ORDER — AMOXICILLIN 500 MG PO CAPS: 500.0000 mg | ORAL_CAPSULE | Freq: Three times a day (TID) | ORAL | 0 refills | Status: DC

## 2015-10-18 MED ORDER — KETOROLAC TROMETHAMINE 30 MG/ML IJ SOLN
30.0000 mg | Freq: Once | INTRAMUSCULAR | Status: AC
  Administered 2015-10-18: 30 mg via INTRAVENOUS
  Filled 2015-10-18: qty 1

## 2015-10-18 MED ORDER — LISINOPRIL-HYDROCHLOROTHIAZIDE 10-12.5 MG PO TABS: 1.0000 | ORAL_TABLET | Freq: Every day | ORAL | 1 refills | Status: DC

## 2015-10-18 MED ORDER — CYCLOBENZAPRINE HCL 10 MG PO TABS: 10.0000 mg | ORAL_TABLET | Freq: Two times a day (BID) | ORAL | 0 refills | Status: DC | PRN

## 2015-10-18 MED ORDER — SODIUM CHLORIDE 0.9 % IV BOLUS (SEPSIS)
500.0000 mL | Freq: Once | INTRAVENOUS | Status: AC
  Administered 2015-10-18: 500 mL via INTRAVENOUS

## 2015-10-18 MED ORDER — METHOCARBAMOL 1000 MG/10ML IJ SOLN
1000.0000 mg | Freq: Once | INTRAVENOUS | Status: AC
  Administered 2015-10-18: 1000 mg via INTRAVENOUS
  Filled 2015-10-18: qty 10

## 2015-10-18 MED ORDER — NAPROXEN 375 MG PO TABS: 375.0000 mg | ORAL_TABLET | Freq: Two times a day (BID) | ORAL | 0 refills | Status: DC

## 2015-10-18 NOTE — ED Provider Notes (Signed)
[facility] Provider Note   CSN: [id number] Arrival date & time: 10/18/15  1448  First Provider Contact:  First MD Initiated Contact with Patient 10/18/15 1517        History   Chief Complaint Chief Complaint  Patient presents with  . URI    HPI Barbara Fox is a 45 y.o. female.  HPI   Pt with hx HTN p/w 1 week of nasal congestion, rhinorrhea, sore throat headache then this morning developed neck stiffness, light sensitivity.  Pain in neck is worse with flexing neck and with raising arms over the head.  Has had fever to 102.  Also has had urinary frequency.  Denies cough, SOB.  LMP a few days ago was normal and on time.   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   Past Medical History:  Diagnosis Date  . Hypertension     There are no active problems to display for this patient.   Past Surgical History:  Procedure Laterality Date  . TUBAL LIGATION      OB History    No data available       Home Medications    Prior to Admission medications   Medication Sig Start Date End Date Taking? Authorizing Provider  hydrochlorothiazide (HYDRODIURIL) 25 MG tablet Take 25 mg by mouth daily.   Yes Historical Provider, MD  atenolol (TENORMIN) 25 MG tablet Take 1 tablet (25 mg total) by mouth daily. 12/12/10 12/12/11  Sunnie Nielsen, MD  cetirizine (ZYRTEC) 10 MG tablet Take 1 tablet (10 mg total) by mouth daily. 01/20/12   Johnsie Kindred, NP  fluticasone (FLONASE) 50 MCG/ACT nasal spray Place 2 sprays into the nose daily. 01/20/12   Johnsie Kindred, NP  lisinopril (PRINIVIL,ZESTRIL) 10 MG tablet Take 10 mg by mouth daily.      Historical Provider, MD  naproxen (NAPROSYN) 375 MG tablet Take 1 tablet (375 mg total) by mouth 2 (two) times daily. 01/20/12   Johnsie Kindred, NP  naproxen sodium (ANAPROX) 220 MG tablet Take 220 mg by mouth 2 (two) times daily with a meal.    Historical Provider, MD  OVER THE COUNTER MEDICATION Cough drop     Historical Provider, MD  sodium chloride (OCEAN NASAL SPRAY) 0.65 % nasal spray Place 1 spray into the nose as needed for congestion. 01/20/12   Johnsie Kindred, NP    Family History No family history on file.  Social History Social History  Substance Use Topics  . Smoking status: Never Smoker  . Smokeless tobacco: Not on file  . Alcohol use Yes     Allergies   Shellfish allergy   Review of Systems Review of Systems  All other systems reviewed and are negative.    Physical Exam Updated Vital Signs BP (!) 152/117   Pulse 83   Temp 98.2 F (36.8 C)   Resp 16   Ht 5\' 3"  (1.6 m)   Wt 81.6 kg   LMP 10/16/2015   SpO2 100%   BMI 31.89 kg/m   Physical Exam  Constitutional: She appears well-developed and well-nourished. No distress.  HENT:  Head: Normocephalic and atraumatic.  Right Ear: Ear canal normal.  Left Ear: Tympanic membrane and ear canal normal.  Mouth/Throat: Posterior oropharyngeal erythema present. No oropharyngeal exudate, posterior oropharyngeal edema or tonsillar abscesses.  Right TM with purulent effusion    Eyes: Conjunctivae are normal.  Neck: Neck supple. Muscular tenderness present. No Kernig's sign noted.  Holds head and neck very straight, holds neck stable when sitting up.   Lying flat, can raise legs up to 90 degrees without pain in the neck or head. (Negative Kernig's sign).  Unable to perform Brudzinski's due to significant pain with flexion of neck.    Cardiovascular: Normal rate and regular rhythm.   Pulmonary/Chest: Effort normal and breath sounds normal. No respiratory distress. She has no wheezes. She has no rales.  Neurological: She is alert.  Skin: She is not diaphoretic.  Nursing note and vitals reviewed.    ED Treatments / Results  Labs (all labs ordered are listed, but only abnormal results are displayed) Labs Reviewed  RAPID STREP SCREEN (NOT AT [facility])  CSF CULTURE  GRAM STAIN  PREGNANCY, URINE  URINALYSIS, ROUTINE W  REFLEX MICROSCOPIC (NOT AT [facility])  BASIC METABOLIC PANEL  CBC WITH DIFFERENTIAL/PLATELET  CSF CELL COUNT WITH DIFFERENTIAL  CSF CELL COUNT WITH DIFFERENTIAL  GLUCOSE, CSF  PROTEIN, CSF    EKG  EKG Interpretation None       Radiology No results found.  Procedures .Lumbar Puncture Date/Time: 10/18/2015 5:15 PM Performed by: Trixie Dredge Authorized by: Trixie Dredge   Consent:    Consent obtained:  Written and verbal   Consent given by:  Patient   Risks discussed:  Bleeding, headache, infection, nerve damage, pain and repeat procedure   Alternatives discussed:  No treatment, delayed treatment, alternative treatment and observation Pre-procedure details:    Procedure purpose:  Diagnostic   Preparation: Patient was prepped and draped in usual sterile fashion   Anesthesia (see MAR for exact dosages):    Anesthesia method:  Local infiltration   Local anesthetic:  Lidocaine 1% w/o epi Procedure details:    Lumbar space:  L3-L4 interspace   Patient position:  Sitting   Needle gauge:  22   Needle type:  Diamond point   Needle length (in):  3.5   Ultrasound guidance: no     Number of attempts:  1   Fluid appearance:  Clear   Tubes of fluid:  4   Total volume (ml):  4 Post-procedure:    Puncture site:  Adhesive bandage applied and direct pressure applied   Patient tolerance of procedure:  Tolerated well, no immediate complications   (including critical care time)  Medications Ordered in ED Medications  sodium chloride 0.9 % bolus 500 mL (not administered)  methocarbamol (ROBAXIN) 1,000 mg in dextrose 5 % 50 mL IVPB (not administered)  ketorolac (TORADOL) 30 MG/ML injection 30 mg (not administered)  methocarbamol (ROBAXIN) 1000 MG/10ML injection (not administered)     Initial Impression / Assessment and Plan / ED Course  I have reviewed the triage vital signs and the nursing notes.  Pertinent labs & imaging results that were available during my care of the patient were  reviewed by me and considered in my medical decision making (see chart for details).  Clinical Course    Well appearing patient with hx fever, URI symptoms x 1 week with headache, stiff neck today.  Discussed pt with Dr Fredderick Phenix who also saw the patient.  Please see her note for further details.  Lumbar puncture performed, CSF tests pending;  Labs, UA pending at end of my shift.  Pt signed out to Dr Fredderick Phenix pending results.    Final Clinical Impressions(s) / ED Diagnoses   Final diagnoses:  None    New Prescriptions New Prescriptions   No medications on file     Trixie Dredge, PA-C 10/18/15  1718    Rolan Bucco, MD 10/18/15 2203

## 2015-10-18 NOTE — ED Notes (Signed)
PA-C at bedside 

## 2015-10-18 NOTE — ED Triage Notes (Signed)
Pt c/o URI symptoms x 1 week also c/o neck "stiffness and back pain "

## 2015-10-18 NOTE — ED Notes (Addendum)
Gilmore Laroche, MD, Irving Burton, PA-C at bedside to perform LP

## 2015-10-19 ENCOUNTER — Emergency Department (HOSPITAL_BASED_OUTPATIENT_CLINIC_OR_DEPARTMENT_OTHER)
Admission: EM | Admit: 2015-10-19 | Discharge: 2015-10-19 | Disposition: A | Discharge status: Home / Self Care | Payer: PRIVATE HEALTH INSURANCE | Attending: Emergency Medicine | Admitting: Emergency Medicine

## 2015-10-19 ENCOUNTER — Encounter (HOSPITAL_BASED_OUTPATIENT_CLINIC_OR_DEPARTMENT_OTHER): Payer: Self-pay | Admitting: *Deleted

## 2015-10-19 DIAGNOSIS — R197 Diarrhea, unspecified: Secondary | ICD-10-CM | POA: Insufficient documentation

## 2015-10-19 DIAGNOSIS — R112 Nausea with vomiting, unspecified: Secondary | ICD-10-CM

## 2015-10-19 DIAGNOSIS — I10 Essential (primary) hypertension: Secondary | ICD-10-CM | POA: Insufficient documentation

## 2015-10-19 DIAGNOSIS — Z79899 Other long term (current) drug therapy: Secondary | ICD-10-CM | POA: Insufficient documentation

## 2015-10-19 DIAGNOSIS — G971 Other reaction to spinal and lumbar puncture: Secondary | ICD-10-CM | POA: Insufficient documentation

## 2015-10-19 LAB — CBC WITH DIFFERENTIAL/PLATELET
BASOS ABS: 0 10*3/uL (ref 0.0–0.1)
BASOS PCT: 0 %
EOS ABS: 0 10*3/uL (ref 0.0–0.7)
Eosinophils Relative: 1 %
HEMATOCRIT: 36.4 % (ref 36.0–46.0)
HEMOGLOBIN: 12.2 g/dL (ref 12.0–15.0)
Lymphocytes Relative: 14 %
Lymphs Abs: 1.1 10*3/uL (ref 0.7–4.0)
MCH: 27.7 pg (ref 26.0–34.0)
MCHC: 33.5 g/dL (ref 30.0–36.0)
MCV: 82.7 fL (ref 78.0–100.0)
MONOS PCT: 6 %
Monocytes Absolute: 0.4 10*3/uL (ref 0.1–1.0)
NEUTROS ABS: 6.1 10*3/uL (ref 1.7–7.7)
NEUTROS PCT: 79 %
Platelets: 397 10*3/uL (ref 150–400)
RBC: 4.4 MIL/uL (ref 3.87–5.11)
RDW: 13.1 % (ref 11.5–15.5)
WBC: 7.6 10*3/uL (ref 4.0–10.5)

## 2015-10-19 LAB — BASIC METABOLIC PANEL
Anion gap: 9 (ref 5–15)
BUN: 7 mg/dL (ref 6–20)
CO2: 27 mmol/L (ref 22–32)
Calcium: 9.1 mg/dL (ref 8.9–10.3)
Chloride: 100 mmol/L — ABNORMAL LOW (ref 101–111)
Creatinine, Ser: 0.76 mg/dL (ref 0.44–1.00)
GFR calc Af Amer: >60 mL/min (ref >60)
GFR calc non Af Amer: >60 mL/min (ref >60)
Glucose, Bld: 102 mg/dL — ABNORMAL HIGH (ref 65–99)
Potassium: 3.3 mmol/L — ABNORMAL LOW (ref 3.5–5.1)
Sodium: 136 mmol/L (ref 135–145)

## 2015-10-19 MED ORDER — DIPHENHYDRAMINE HCL 50 MG/ML IJ SOLN
25.0000 mg | Freq: Once | INTRAMUSCULAR | Status: AC
  Administered 2015-10-19: 25 mg via INTRAVENOUS
  Filled 2015-10-19: qty 1

## 2015-10-19 MED ORDER — SODIUM CHLORIDE 0.9 % IV BOLUS (SEPSIS)
1000.0000 mL | Freq: Once | INTRAVENOUS | Status: AC
  Administered 2015-10-19: 1000 mL via INTRAVENOUS

## 2015-10-19 MED ORDER — PROCHLORPERAZINE EDISYLATE 5 MG/ML IJ SOLN
10.0000 mg | Freq: Once | INTRAMUSCULAR | Status: AC
  Administered 2015-10-19: 10 mg via INTRAVENOUS
  Filled 2015-10-19: qty 2

## 2015-10-19 MED ORDER — ONDANSETRON 4 MG PO TBDP: ORAL_TABLET | ORAL | 0 refills | Status: DC

## 2015-10-19 MED ORDER — POTASSIUM CHLORIDE CRYS ER 20 MEQ PO TBCR
40.0000 meq | EXTENDED_RELEASE_TABLET | Freq: Once | ORAL | Status: AC
  Administered 2015-10-19: 40 meq via ORAL
  Filled 2015-10-19: qty 2

## 2015-10-19 NOTE — ED Provider Notes (Signed)
[facility]   CSN: [id number] Arrival date & time: 10/19/15  2003  First Provider Contact:  First MD Initiated Contact with Patient 10/19/15 2156     By signing my name below, I, Soijett Blue, attest that this documentation has been prepared under the direction and in the presence of Melene Plan, DO. Electronically Signed: Soijett Blue, ED Scribe. 10/19/15. 10:06 PM.   History   Chief Complaint Chief Complaint  Patient presents with  . Emesis  . Diarrhea    HPI Barbara Fox is a 45 y.o. female who presents to the Emergency Department complaining of emesis onset yesterday. 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 She states that she is having associated symptoms of diarrhea and increasing HA. Pt notes that her HA is worsened with sitting. Pt reports that her HA is alleviated with laying down and standing. Pt reports that she was seen in the ED yesterday for a HA and had a LP performed. She states that she has not tried any medications for the relief for her symptoms. She denies blood in stool, fever, and any other symptoms.  Per pt chart review: Pt was seen in the ED on 10/18/2015 for URI. Pt had an LP performed with CSF results pending. Pt had UA and labs completed with abnormal results. Pt was Rx amoxicillin, flexeril, lisinopril-HCTZ, and naprosyn for their symptoms.   The history is provided by the patient. No language interpreter was used.  Emesis   This is a new problem. The current episode started yesterday. The problem has not changed since onset.There has been no fever. Associated symptoms include diarrhea and headaches. Pertinent negatives include no arthralgias, no chills, no fever and no myalgias.  Diarrhea   This is a new problem. The current episode started yesterday. The problem has not changed since onset.There has been no fever. Associated symptoms include vomiting and headaches. Pertinent negatives include no chills, no  arthralgias and no myalgias. She has tried nothing for the symptoms. The treatment provided no relief.    Past Medical History:  Diagnosis Date  . Hypertension     There are no active problems to display for this patient.   Past Surgical History:  Procedure Laterality Date  . TUBAL LIGATION      OB History    No data available       Home Medications    Prior to Admission medications   Medication Sig Start Date End Date Taking? Authorizing Provider  amoxicillin (AMOXIL) 500 MG capsule Take 1 capsule (500 mg total) by mouth 3 (three) times daily. 10/18/15   Rolan Bucco, MD  atenolol (TENORMIN) 25 MG tablet Take 1 tablet (25 mg total) by mouth daily. 12/12/10 12/12/11  Sunnie Nielsen, MD  cetirizine (ZYRTEC) 10 MG tablet Take 1 tablet (10 mg total) by mouth daily. 01/20/12   Johnsie Kindred, NP  cyclobenzaprine (FLEXERIL) 10 MG tablet Take 1 tablet (10 mg total) by mouth 2 (two) times daily as needed for muscle spasms. 10/18/15   Rolan Bucco, MD  fluticasone (FLONASE) 50 MCG/ACT nasal spray Place 2 sprays into the nose daily. 01/20/12   Johnsie Kindred, NP  hydrochlorothiazide (HYDRODIURIL) 25 MG tablet Take 25 mg by mouth daily.    Historical Provider, MD  lisinopril (PRINIVIL,ZESTRIL) 10 MG tablet Take 10 mg by mouth daily.      Historical Provider, MD  lisinopril-hydrochlorothiazide (PRINZIDE,ZESTORETIC) 10-12.5 MG tablet Take 1 tablet by mouth daily. 10/18/15   Melanie  Belfi, MD  naproxen (NAPROSYN) 375 MG tablet Take 1 tablet (375 mg total) by mouth 2 (two) times daily. 10/18/15   Rolan Bucco, MD  naproxen sodium (ANAPROX) 220 MG tablet Take 220 mg by mouth 2 (two) times daily with a meal.    Historical Provider, MD  ondansetron (ZOFRAN ODT) 4 MG disintegrating tablet  ODT q4 hours prn nausea/vomit 10/19/15   Melene Plan, DO  OVER THE COUNTER MEDICATION Cough drop    Historical Provider, MD  sodium chloride (OCEAN NASAL SPRAY) 0.65 % nasal spray Place 1 spray into the nose as  needed for congestion. 01/20/12   Johnsie Kindred, NP    Family History No family history on file.  Social History Social History  Substance Use Topics  . Smoking status: Never Smoker  . Smokeless tobacco: Never Used  . Alcohol use Yes     Allergies   Shellfish allergy   Review of Systems Review of Systems  Constitutional: Negative for chills and fever.  HENT: Negative for congestion and rhinorrhea.   Eyes: Negative for redness and visual disturbance.  Respiratory: Negative for shortness of breath and wheezing.   Cardiovascular: Negative for chest pain and palpitations.  Gastrointestinal: Positive for diarrhea and vomiting. Negative for nausea.  Genitourinary: Negative for dysuria and urgency.  Musculoskeletal: Negative for arthralgias and myalgias.  Skin: Negative for pallor and wound.  Neurological: Positive for headaches. Negative for dizziness.  All other systems reviewed and are negative.    Physical Exam Updated Vital Signs BP 141/89 (BP Location: Right Arm)   Pulse 69   Temp 98.1 F (36.7 C) (Oral)   Resp 18   Ht  (1.6 m)   Wt 180 lb (81.6 kg)   LMP 10/16/2015   SpO2 100%   BMI 31.89 kg/m   Physical Exam  Constitutional: She is oriented to person, place, and time. She appears well-developed and well-nourished. No distress.  HENT:  Head: Normocephalic and atraumatic.  Eyes: EOM are normal.  Neck: Neck supple.  Cardiovascular: Normal rate.   Pulmonary/Chest: Effort normal. No respiratory distress.  Abdominal: Soft. She exhibits no distension. There is no tenderness.  No focal abdominal tenderness.   Musculoskeletal: Normal range of motion.  Neurological: She is alert and oriented to person, place, and time.  Skin: Skin is warm and dry.  Psychiatric: She has a normal mood and affect. Her behavior is normal.  Nursing note and vitals reviewed.    ED Treatments / Results  DIAGNOSTIC STUDIES: Oxygen Saturation is 100% on RA, nl by my  interpretation.    COORDINATION OF CARE: 10:01 PM Discussed treatment plan with pt at bedside which includes compazine, benadryl, labs, and pt agreed to plan.   Labs (all labs ordered are listed, but only abnormal results are displayed) Labs Reviewed  BASIC METABOLIC PANEL - Abnormal; Notable for the following:       Result Value   Potassium 3.3 (*)    Chloride 100 (*)    Glucose, Bld 102 (*)    All other components within normal limits  CBC WITH DIFFERENTIAL/PLATELET    EKG  EKG Interpretation None       Radiology No results found.  Procedures Procedures (including critical care time)  Medications Ordered in ED Medications  potassium chloride SA (K-DUR,KLOR-CON) CR tablet 40 mEq (not administered)  prochlorperazine (COMPAZINE) injection 10 mg (10 mg Intravenous Given 10/19/15 2232)  sodium chloride 0.9 % bolus 1,000 mL (1,000 mLs Intravenous New Bag/Given 10/19/15 2231)  diphenhydrAMINE (BENADRYL) injection 25 mg (25 mg Intravenous Given 10/19/15 2231)     Initial Impression / Assessment and Plan / ED Course  I have reviewed the triage vital signs and the nursing notes.  Pertinent labs & imaging results that were available during my care of the patient were reviewed by me and considered in my medical decision making (see chart for details).  Clinical Course    45 yo F With a chief complaint of a headache and nausea vomiting diarrhea. She was recently in the ED yesterday for a headache and fever had a lumbar puncture and was discharged home. Since then patient has a headache that is worse with sitting up, and improvement will lying flat. She also has had nausea vomiting and diarrhea. Multiple episodes throughout the day. Having some abdominal pain off and on with this. No noted abdominal pain on my exam. Will treat with a headache cocktail. Reassess.  Patient feeling much better on reassessment. Tolerating by mouth. Suggested strict bedrest with caffeine intake  increased. Discharge home.  11:15 PM:  I have discussed the diagnosis/risks/treatment options with the patient and believe the pt to be eligible for discharge home to follow-up with PCP. We also discussed returning to the ED immediately if new or worsening sx occur. We discussed the sx which are most concerning (e.g., sudden worsening pain, fever, inability to tolerate by mouth) that necessitate immediate return. Medications administered to the patient during their visit and any new prescriptions provided to the patient are listed below.  Medications given during this visit Medications  potassium chloride SA (K-DUR,KLOR-CON) CR tablet 40 mEq (not administered)  prochlorperazine (COMPAZINE) injection 10 mg (10 mg Intravenous Given 10/19/15 2232)  sodium chloride 0.9 % bolus 1,000 mL (1,000 mLs Intravenous New Bag/Given 10/19/15 2231)  diphenhydrAMINE (BENADRYL) injection 25 mg (25 mg Intravenous Given 10/19/15 2231)     The patient appears reasonably screen and/or stabilized for discharge and I doubt any other medical condition or other [facility] requiring further screening, evaluation, or treatment in the ED at this time prior to discharge.    Final Clinical Impressions(s) / ED Diagnoses   Final diagnoses:  Nausea vomiting and diarrhea  Lumbar puncture headache    New Prescriptions New Prescriptions   ONDANSETRON (ZOFRAN ODT) 4 MG DISINTEGRATING TABLET    4mg  ODT q4 hours prn nausea/vomit    I personally performed the services described in this documentation, which was scribed in my presence. The recorded information has been reviewed and is accurate.      Melene Plan, DO 10/19/15 2315

## 2015-10-21 LAB — CSF CULTURE W GRAM STAIN: Culture: NO GROWTH

## 2015-10-21 LAB — CSF CULTURE

## 2015-10-22 LAB — CULTURE, GROUP A STREP (THRC)

## 2015-12-22 ENCOUNTER — Emergency Department (HOSPITAL_BASED_OUTPATIENT_CLINIC_OR_DEPARTMENT_OTHER)
Admission: EM | Admit: 2015-12-22 | Discharge: 2015-12-22 | Disposition: A | Discharge status: Home / Self Care | Payer: PRIVATE HEALTH INSURANCE | Attending: Emergency Medicine | Admitting: Emergency Medicine

## 2015-12-22 ENCOUNTER — Encounter (HOSPITAL_BASED_OUTPATIENT_CLINIC_OR_DEPARTMENT_OTHER): Payer: Self-pay

## 2015-12-22 DIAGNOSIS — Z79899 Other long term (current) drug therapy: Secondary | ICD-10-CM | POA: Insufficient documentation

## 2015-12-22 DIAGNOSIS — J0110 Acute frontal sinusitis, unspecified: Secondary | ICD-10-CM

## 2015-12-22 DIAGNOSIS — I10 Essential (primary) hypertension: Secondary | ICD-10-CM | POA: Insufficient documentation

## 2015-12-22 MED ORDER — AMOXICILLIN-POT CLAVULANATE 875-125 MG PO TABS: 1.0000 | ORAL_TABLET | Freq: Two times a day (BID) | ORAL | 0 refills | Status: DC

## 2015-12-22 MED FILL — AMOX-CLAV 875-125 MG TABLET: 875-125 | 10 days supply | Qty: 20 | Fill #0

## 2015-12-22 NOTE — ED Provider Notes (Signed)
[facility]   CSN: [id number] Arrival date & time: 12/22/15  1336    History   Chief Complaint Chief Complaint  Patient presents with  . Neck Pain    HPI Barbara Fox is a 45 y.o. female.  HPI   45 year old female presents today with complaints of upper respiratory infection. Patient reports symptoms started 5 days ago with sinus congestion and sore throat. She notes that the sinus drainage is foul smelling. She denies any significant sinus pain or tenderness, swelling in the face. She also notes a sore throat described as tightness. Initially patient reported she could not move her neck, but notes that she can move it is just tight in the anterior aspect. She denies any shortness of breath difficulty swallowing, or handling secretions. No muffled voice. She denies any fever at home, or any other concerning signs or symptoms. Patient reports that once he or she had sinus infection similar to this.    Past Medical History:  Diagnosis Date  . Hypertension     There are no active problems to display for this patient.   Past Surgical History:  Procedure Laterality Date  . TUBAL LIGATION      OB History    No data available       Home Medications    Prior to Admission medications   Medication Sig Start Date End Date Taking? Authorizing Provider  amoxicillin-clavulanate (AUGMENTIN) 875-125 MG tablet Take 1 tablet by mouth every 12 (twelve) hours. 12/22/15   Eyvonne Mechanic, PA-C  hydrochlorothiazide (HYDRODIURIL) 25 MG tablet Take 25 mg by mouth daily.    Historical Provider, MD  lisinopril (PRINIVIL,ZESTRIL) 10 MG tablet Take 10 mg by mouth daily.      Historical Provider, MD  lisinopril-hydrochlorothiazide (PRINZIDE,ZESTORETIC) 10-12.5 MG tablet Take 1 tablet by mouth daily. 10/18/15   Rolan Bucco, MD  naproxen (NAPROSYN) 375 MG tablet Take 1 tablet (375 mg total) by mouth 2 (two) times daily. 10/18/15   Rolan Bucco, MD  naproxen sodium  (ANAPROX) 220 MG tablet Take 220 mg by mouth 2 (two) times daily with a meal.    Historical Provider, MD  sodium chloride (OCEAN NASAL SPRAY) 0.65 % nasal spray Place 1 spray into the nose as needed for congestion. 01/20/12   Johnsie Kindred, NP    Family History No family history on file.  Social History Social History  Substance Use Topics  . Smoking status: Never Smoker  . Smokeless tobacco: Never Used  . Alcohol use No     Allergies   Shellfish allergy   Review of Systems Review of Systems  All other systems reviewed and are negative.    Physical Exam Updated Vital Signs BP 166/95 (BP Location: Left Arm)   Pulse 80   Temp 98.2 F (36.8 C) (Oral)   Resp 18   Ht 5\' 3"  (1.6 m)   Wt 77.1 kg   LMP 12/16/2015   SpO2 98%   BMI 30.11 kg/m   Physical Exam  Constitutional: She is oriented to person, place, and time. She appears well-developed and well-nourished.  HENT:  Head: Normocephalic and atraumatic.  No signs of postpharyngeal edema or swelling, no signs of retropharyngeal abscess, peritonsillar abscess. Tonsils are equal and symmetrical with no exudate, uvula is midline and rises phonation. No pooling of secretions.  No drainage noted from the sinuses, bilateral TMs normal  Neck is supple with no asymmetry, full range of motion  Eyes: Conjunctivae are normal. Pupils are  equal, round, and reactive to light. Right eye exhibits no discharge. Left eye exhibits no discharge. No scleral icterus.  Neck: Normal range of motion. No JVD present. No tracheal deviation present.  Pulmonary/Chest: Effort normal. No stridor.  Neurological: She is alert and oriented to person, place, and time. Coordination normal.  Psychiatric: She has a normal mood and affect. Her behavior is normal. Judgment and thought content normal.  Nursing note and vitals reviewed.   ED Treatments / Results  Labs (all labs ordered are listed, but only abnormal results are displayed) Labs Reviewed  - No data to display  EKG  EKG Interpretation None       Radiology No results found.  Procedures Procedures (including critical care time)  Medications Ordered in ED Medications - No data to display   Initial Impression / Assessment and Plan / ED Course  I have reviewed the triage vital signs and the nursing notes.  Pertinent labs & imaging results that were available during my care of the patient were reviewed by me and considered in my medical decision making (see chart for details).  Clinical Course     Final Clinical Impressions(s) / ED Diagnoses   Final diagnoses:  Acute frontal sinusitis, recurrence not specified   Labs:  Imaging:  Consults:  Therapeutics:  Discharge Meds: Augmentin  Assessment/Plan:  45 year old female presents today with likely sinusitis. Patient's symptoms 5 days with reported foul-smelling drainage. Patient will be treated for bacterial sinusitis with Augmentin. She reports neck stiffness, this is more consistent with soreness of her throat. Originally patient will not look side to side, when distracted she will freely move her head and neck. She will allow me to passively range her neck without any significant pain difficulty, have very low suspicion for any significant infectious etiology in the throat, no meningeal signs or symptoms. Patient afebrile     New Prescriptions Discharge Medication List as of 12/22/2015  2:20 PM       Eyvonne MechanicJeffrey Lorene Samaan, PA-C 12/22/15 1609    Geoffery Lyonsouglas Delo, MD 12/23/15 [phone]

## 2015-12-22 NOTE — ED Triage Notes (Signed)
C/o stiff neck started yesterday-woke today with "throat tightness", foul smelling nasal d/c-denies neck injury-NAD-steady gait

## 2015-12-22 NOTE — Discharge Instructions (Signed)
Please read attached information. If you experience any new or worsening signs or symptoms please return to the emergency room for evaluation. Please follow-up with your primary care provider or specialist as discussed. Please use medication prescribed only as directed and discontinue taking if you have any concerning signs or symptoms.   °

## 2016-07-15 ENCOUNTER — Emergency Department (HOSPITAL_BASED_OUTPATIENT_CLINIC_OR_DEPARTMENT_OTHER): Payer: No Typology Code available for payment source

## 2016-07-15 ENCOUNTER — Emergency Department (HOSPITAL_BASED_OUTPATIENT_CLINIC_OR_DEPARTMENT_OTHER)
Admission: EM | Admit: 2016-07-15 | Discharge: 2016-07-15 | Disposition: A | Discharge status: Home / Self Care | Payer: No Typology Code available for payment source | Attending: Emergency Medicine | Admitting: Emergency Medicine

## 2016-07-15 ENCOUNTER — Encounter (HOSPITAL_BASED_OUTPATIENT_CLINIC_OR_DEPARTMENT_OTHER): Payer: Self-pay | Admitting: Emergency Medicine

## 2016-07-15 DIAGNOSIS — W07XXXA Fall from chair, initial encounter: Secondary | ICD-10-CM | POA: Diagnosis not present

## 2016-07-15 DIAGNOSIS — Z79899 Other long term (current) drug therapy: Secondary | ICD-10-CM | POA: Insufficient documentation

## 2016-07-15 DIAGNOSIS — I10 Essential (primary) hypertension: Secondary | ICD-10-CM | POA: Insufficient documentation

## 2016-07-15 DIAGNOSIS — W19XXXA Unspecified fall, initial encounter: Secondary | ICD-10-CM

## 2016-07-15 DIAGNOSIS — Y939 Activity, unspecified: Secondary | ICD-10-CM | POA: Insufficient documentation

## 2016-07-15 DIAGNOSIS — M25532 Pain in left wrist: Secondary | ICD-10-CM

## 2016-07-15 DIAGNOSIS — Y999 Unspecified external cause status: Secondary | ICD-10-CM | POA: Diagnosis not present

## 2016-07-15 DIAGNOSIS — Y929 Unspecified place or not applicable: Secondary | ICD-10-CM | POA: Diagnosis not present

## 2016-07-15 DIAGNOSIS — S63651A Sprain of metacarpophalangeal joint of left index finger, initial encounter: Secondary | ICD-10-CM | POA: Diagnosis not present

## 2016-07-15 DIAGNOSIS — S6992XA Unspecified injury of left wrist, hand and finger(s), initial encounter: Secondary | ICD-10-CM | POA: Diagnosis present

## 2016-07-15 NOTE — Discharge Instructions (Signed)
Xray show no fracture today. Please rest ice and elevated the hand. Follow up with orthopedics if symptoms persists. Return to the ED if your symptoms worsen.

## 2016-07-15 NOTE — ED Notes (Signed)
Pt verbalized understanding of discharge instructions and denies any further questions at this time.   

## 2016-07-15 NOTE — ED Triage Notes (Signed)
Patient reports that she feel last night and hurt her left hand. Bruising  noted and swelling to the left 1st finger

## 2016-07-15 NOTE — ED Provider Notes (Signed)
[facility] Provider Note   CSN: [id number] Arrival date & time: 07/15/16  1551   By signing my name below, I, Talbert Nan, attest that this documentation has been prepared under the direction and in the presence of Demetrios Loll, PA-C. Electronically Signed: Talbert Nan, Scribe. 07/15/16. 4:52 PM.    History   Chief Complaint Chief Complaint  Patient presents with  . Hand Pain    HPI Barbara Fox is a 46 y.o. female who presents to the Emergency Department complaining of left wrist pain s/p mechanical fall while drinking last night. Pt has associated left index finger pain. Wrist pain is worse with pronation and supination. Finger pain is worse with flexion and extension of the MCP. Patient went to work today but decided come to the ED for evaluation. Pt has not taken anything for the pain PTA. Pt denies elbow pain, head injury, LOC, numbness, tingling.    The history is provided by the patient. No language interpreter was used.    Past Medical History:  Diagnosis Date  . Hypertension     There are no active problems to display for this patient.   Past Surgical History:  Procedure Laterality Date  . TUBAL LIGATION      OB History    No data available       Home Medications    Prior to Admission medications   Medication Sig Start Date End Date Taking? Authorizing Provider  amoxicillin-clavulanate (AUGMENTIN) 875-125 MG tablet Take 1 tablet by mouth every 12 (twelve) hours. 12/22/15   Eyvonne Mechanic, PA-C  hydrochlorothiazide (HYDRODIURIL) 25 MG tablet Take 25 mg by mouth daily.    Historical Provider, MD  lisinopril (PRINIVIL,ZESTRIL) 10 MG tablet Take 10 mg by mouth daily.      Historical Provider, MD  lisinopril-hydrochlorothiazide (PRINZIDE,ZESTORETIC) 10-12.5 MG tablet Take 1 tablet by mouth daily. 10/18/15   Rolan Bucco, MD  naproxen (NAPROSYN) 375 MG tablet Take 1 tablet (375 mg total) by mouth 2 (two) times daily. 10/18/15   Rolan Bucco, MD   naproxen sodium (ANAPROX) 220 MG tablet Take 220 mg by mouth 2 (two) times daily with a meal.    Historical Provider, MD  sodium chloride (OCEAN NASAL SPRAY) 0.65 % nasal spray Place 1 spray into the nose as needed for congestion. 01/20/12   Johnsie Kindred, NP    Family History History reviewed. No pertinent family history.  Social History Social History  Substance Use Topics  . Smoking status: Never Smoker  . Smokeless tobacco: Never Used  . Alcohol use No     Allergies   Shellfish allergy   Review of Systems Review of Systems  Constitutional: Negative for fever.  Musculoskeletal: Positive for arthralgias and myalgias. Negative for back pain, gait problem, neck pain and neck stiffness.  Skin: Negative for wound.  Neurological: Negative for dizziness, syncope, light-headedness, numbness and headaches.     Physical Exam Updated Vital Signs BP (!) 168/94 (BP Location: Right Arm)   Pulse 61   Temp 98.6 F (37 C) (Oral)   Resp 18   Ht  (1.6 m)   Wt 170 lb (77.1 kg)   LMP 07/07/2016   SpO2 100%   BMI 30.11 kg/m   Physical Exam  Constitutional: She is oriented to person, place, and time. She appears well-developed and well-nourished. No distress.  HENT:  Head: Normocephalic and atraumatic.  Neck: Normal range of motion. Neck supple.  No midline tenderness. No deformities or step-offs noted.  Cardiovascular: Normal rate.   Pulmonary/Chest: Effort normal.  Musculoskeletal: Normal range of motion.       Left wrist: She exhibits tenderness and bony tenderness. She exhibits normal range of motion (painful with supination and pronation), no swelling, no effusion, no crepitus, no deformity and no laceration.  Patient with full range of motion the left wrist but mild pain with pronation and supination. Radial pulses are 2+ bilaterally. No obvious deformity noted. No edema, ecchymosis, wound. Full range of motion the left elbow without pain. Cap refill is normal.  Sensation intact to sharp/dull. Pain to the second MCP joint with flexion and extension. No pain with range of motion of the DIP or PIP. No scaphoid tenderness.  Neurological: She is alert and oriented to person, place, and time.  Skin: Skin is warm and dry. Capillary refill takes less than 2 seconds.  Psychiatric: She has a normal mood and affect.  Nursing note and vitals reviewed.    ED Treatments / Results   DIAGNOSTIC STUDIES: Oxygen Saturation is 100% on room air, normal by my interpretation.    COORDINATION OF CARE: 4:51 PM Discussed treatment plan with pt at bedside and pt agreed to plan, which includes using a splint and NSAIDs for pain and swelling.    Labs (all labs ordered are listed, but only abnormal results are displayed) Labs Reviewed - No data to display  EKG  EKG Interpretation None       Radiology Dg Wrist Complete Left  Result Date: 07/15/2016 CLINICAL DATA:  46 year old female with medial wrist pain after falling out of a chair last night EXAM: LEFT WRIST - COMPLETE 3+ VIEW COMPARISON:  Concurrently obtained radiographs of the left hand FINDINGS: There is no evidence of fracture or dislocation. There is no evidence of arthropathy or other focal bone abnormality. Soft tissues are unremarkable. IMPRESSION: Negative. Electronically Signed   By: Malachy Moan M.D.   On: 07/15/2016 16:24   Dg Hand Complete Left  Result Date: 07/15/2016 CLINICAL DATA:  46 year old female with left wrist pain after falling off a chair last night EXAM: LEFT HAND - COMPLETE 3+ VIEW COMPARISON:  Concurrently obtained radiographs of the left wrist FINDINGS: There is no evidence of fracture or dislocation. There is no evidence of arthropathy or other focal bone abnormality. Soft tissues are unremarkable. IMPRESSION: Negative. Electronically Signed   By: Malachy Moan M.D.   On: 07/15/2016 16:25    Procedures Procedures (including critical care time)  Medications Ordered in  ED Medications - No data to display   Initial Impression / Assessment and Plan / ED Course  I have reviewed the triage vital signs and the nursing notes.  Pertinent labs & imaging results that were available during my care of the patient were reviewed by me and considered in my medical decision making (see chart for details).     Patient presents to the ED with left wrist and left hand pain following mechanical fall last night. Denies LOC or head injury. Complains of left wrist and left index finger pain. No scaphoid tenderness. Patient is neurovascularly intact. Full range of motion. X-ray showed no fracture. I have given follow-up to orthopedics. Patient was placed in a splint and encouraged rice therapy at home with Motrin and Tylenol. Pt is hemodynamically stable, in NAD, & able to ambulate in the ED. Pain has been managed & has no complaints prior to dc. Pt is comfortable with above plan and is stable for discharge at this time. All questions  were answered prior to disposition. Strict return precautions for f/u to the ED were discussed.  SPLINT APPLICATION Date/Time: 5:05 PM Authorized by: Demetrios Loll Consent: Verbal consent obtained. Risks and benefits: risks, benefits and alternatives were discussed Consent given by: patient Splint applied by: orthopedic technician Location details: left wrsit and index finger Splint type: velcro wrist splint and static figner splint Supplies used: see above Post-procedure: The splinted body part was neurovascularly unchanged following the procedure. Patient tolerance: Patient tolerated the procedure well with no immediate complications.      Final Clinical Impressions(s) / ED Diagnoses   Final diagnoses:  Fall, initial encounter  Left wrist pain  Sprain of metacarpophalangeal (MCP) joint of left index finger, initial encounter    New Prescriptions New Prescriptions   No medications on file   I personally performed the services  described in this documentation, which was scribed in my presence. The recorded information has been reviewed and is accurate.     Rise Mu, PA-C 07/15/16 1706    Canary Brim Tegeler, MD 07/17/16 1013

## 2016-12-10 ENCOUNTER — Encounter (HOSPITAL_BASED_OUTPATIENT_CLINIC_OR_DEPARTMENT_OTHER): Payer: Self-pay | Admitting: Emergency Medicine

## 2016-12-10 ENCOUNTER — Emergency Department (HOSPITAL_BASED_OUTPATIENT_CLINIC_OR_DEPARTMENT_OTHER)
Admission: EM | Admit: 2016-12-10 | Discharge: 2016-12-10 | Disposition: A | Discharge status: Home / Self Care | Payer: No Typology Code available for payment source | Attending: Emergency Medicine | Admitting: Emergency Medicine

## 2016-12-10 DIAGNOSIS — B9689 Other specified bacterial agents as the cause of diseases classified elsewhere: Secondary | ICD-10-CM

## 2016-12-10 DIAGNOSIS — I10 Essential (primary) hypertension: Secondary | ICD-10-CM | POA: Diagnosis not present

## 2016-12-10 DIAGNOSIS — R197 Diarrhea, unspecified: Secondary | ICD-10-CM | POA: Diagnosis not present

## 2016-12-10 DIAGNOSIS — N760 Acute vaginitis: Secondary | ICD-10-CM | POA: Diagnosis not present

## 2016-12-10 DIAGNOSIS — Z79899 Other long term (current) drug therapy: Secondary | ICD-10-CM | POA: Diagnosis not present

## 2016-12-10 DIAGNOSIS — E876 Hypokalemia: Secondary | ICD-10-CM

## 2016-12-10 DIAGNOSIS — R112 Nausea with vomiting, unspecified: Secondary | ICD-10-CM | POA: Diagnosis not present

## 2016-12-10 DIAGNOSIS — R1084 Generalized abdominal pain: Secondary | ICD-10-CM

## 2016-12-10 LAB — URINALYSIS, ROUTINE W REFLEX MICROSCOPIC
Bilirubin Urine: NEGATIVE
GLUCOSE, UA: NEGATIVE mg/dL
KETONES UR: NEGATIVE mg/dL
LEUKOCYTES UA: NEGATIVE
Nitrite: NEGATIVE
PH: 6.5 (ref 5.0–8.0)
Protein, ur: NEGATIVE mg/dL
Specific Gravity, Urine: <1.005 — ABNORMAL LOW (ref 1.005–1.030)

## 2016-12-10 LAB — URINALYSIS, MICROSCOPIC (REFLEX)

## 2016-12-10 LAB — CBC WITH DIFFERENTIAL/PLATELET
BASOS ABS: 0 10*3/uL (ref 0.0–0.1)
Basophils Relative: 0 %
EOS PCT: 0 %
Eosinophils Absolute: 0 10*3/uL (ref 0.0–0.7)
HEMATOCRIT: 34.5 % — AB (ref 36.0–46.0)
Hemoglobin: 11.7 g/dL — ABNORMAL LOW (ref 12.0–15.0)
LYMPHS ABS: 0.8 10*3/uL (ref 0.7–4.0)
Lymphocytes Relative: 8 %
MCH: 28.1 pg (ref 26.0–34.0)
MCHC: 33.9 g/dL (ref 30.0–36.0)
MCV: 82.9 fL (ref 78.0–100.0)
MONO ABS: 0.6 10*3/uL (ref 0.1–1.0)
Monocytes Relative: 7 %
NEUTROS ABS: 8.4 10*3/uL — AB (ref 1.7–7.7)
Neutrophils Relative %: 85 %
Platelets: 322 10*3/uL (ref 150–400)
RBC: 4.16 MIL/uL (ref 3.87–5.11)
RDW: 13.9 % (ref 11.5–15.5)
WBC: 9.8 10*3/uL (ref 4.0–10.5)

## 2016-12-10 LAB — COMPREHENSIVE METABOLIC PANEL
ALBUMIN: 3.8 g/dL (ref 3.5–5.0)
ALT: 12 U/L — ABNORMAL LOW (ref 14–54)
ANION GAP: 8 (ref 5–15)
AST: 22 U/L (ref 15–41)
Alkaline Phosphatase: 58 U/L (ref 38–126)
BILIRUBIN TOTAL: 0.4 mg/dL (ref 0.3–1.2)
BUN: 6 mg/dL (ref 6–20)
CO2: 27 mmol/L (ref 22–32)
Calcium: 8.9 mg/dL (ref 8.9–10.3)
Chloride: 99 mmol/L — ABNORMAL LOW (ref 101–111)
Creatinine, Ser: 0.77 mg/dL (ref 0.44–1.00)
GFR calc Af Amer: >60 mL/min (ref >60)
GFR calc non Af Amer: >60 mL/min (ref >60)
GLUCOSE: 98 mg/dL (ref 65–99)
POTASSIUM: 2.7 mmol/L — AB (ref 3.5–5.1)
SODIUM: 134 mmol/L — AB (ref 135–145)
TOTAL PROTEIN: 7.9 g/dL (ref 6.5–8.1)

## 2016-12-10 LAB — WET PREP, GENITAL
Sperm: NONE SEEN
Trich, Wet Prep: NONE SEEN
Yeast Wet Prep HPF POC: NONE SEEN

## 2016-12-10 LAB — MAGNESIUM: Magnesium: 1.7 mg/dL (ref 1.7–2.4)

## 2016-12-10 LAB — LIPASE, BLOOD: LIPASE: 26 U/L (ref 11–51)

## 2016-12-10 LAB — PREGNANCY, URINE: PREG TEST UR: NEGATIVE

## 2016-12-10 MED ORDER — POTASSIUM CHLORIDE 10 MEQ/100ML IV SOLN
10.0000 meq | Freq: Once | INTRAVENOUS | Status: AC
  Administered 2016-12-10: 10 meq via INTRAVENOUS
  Filled 2016-12-10: qty 100

## 2016-12-10 MED ORDER — SODIUM CHLORIDE 0.9 % IV BOLUS (SEPSIS)
1000.0000 mL | Freq: Once | INTRAVENOUS | Status: AC
  Administered 2016-12-10: 1000 mL via INTRAVENOUS

## 2016-12-10 MED ORDER — POTASSIUM CHLORIDE ER 10 MEQ PO TBCR: 20.0000 meq | EXTENDED_RELEASE_TABLET | Freq: Every day | ORAL | 0 refills | Status: DC

## 2016-12-10 MED ORDER — METRONIDAZOLE 500 MG PO TABS: 500.0000 mg | ORAL_TABLET | Freq: Two times a day (BID) | ORAL | 0 refills | Status: DC

## 2016-12-10 MED ORDER — ONDANSETRON HCL 4 MG/2ML IJ SOLN
4.0000 mg | Freq: Once | INTRAMUSCULAR | Status: AC
  Administered 2016-12-10: 4 mg via INTRAVENOUS
  Filled 2016-12-10: qty 2

## 2016-12-10 MED ORDER — KETOROLAC TROMETHAMINE 30 MG/ML IJ SOLN
15.0000 mg | Freq: Once | INTRAMUSCULAR | Status: AC
  Administered 2016-12-10: 15 mg via INTRAVENOUS
  Filled 2016-12-10: qty 1

## 2016-12-10 MED ORDER — ONDANSETRON 4 MG PO TBDP: ORAL_TABLET | ORAL | 0 refills | Status: DC

## 2016-12-10 MED ORDER — DICYCLOMINE HCL 20 MG PO TABS: 20.0000 mg | ORAL_TABLET | Freq: Two times a day (BID) | ORAL | 0 refills | Status: DC

## 2016-12-10 MED ORDER — POTASSIUM CHLORIDE CRYS ER 20 MEQ PO TBCR
40.0000 meq | EXTENDED_RELEASE_TABLET | Freq: Once | ORAL | Status: AC
  Administered 2016-12-10: 40 meq via ORAL
  Filled 2016-12-10: qty 2

## 2016-12-10 MED ORDER — POTASSIUM CHLORIDE CRYS ER 20 MEQ PO TBCR: 60.0000 meq | EXTENDED_RELEASE_TABLET | Freq: Once | ORAL | Status: DC

## 2016-12-10 MED ORDER — GI COCKTAIL ~~LOC~~
30.0000 mL | Freq: Once | ORAL | Status: AC
  Administered 2016-12-10: 30 mL via ORAL
  Filled 2016-12-10: qty 30

## 2016-12-10 MED FILL — POTASSIUM CL 10 MEQ TAB SA: 10 | 5 days supply | Qty: 10 | Fill #0

## 2016-12-10 MED FILL — metroNIDAZOLE 500 MG TABS: 500 | 7 days supply | Qty: 14 | Fill #0

## 2016-12-10 MED FILL — DICYCLOMINE 20 MG TABLET: 20 | 10 days supply | Qty: 20 | Fill #0

## 2016-12-10 MED FILL — ONDANSETRON ODT 4 MG TABLET: 4 | 1 days supply | Qty: 4 | Fill #0

## 2016-12-10 NOTE — ED Provider Notes (Signed)
[facility]   CSN: [id number] Arrival date & time: 12/10/16  0840     History   Chief Complaint Chief Complaint  Patient presents with  . Abdominal Pain    HPI  Barbara Fox is a 46 y.o. Female with a history of hypertension and uterine fibroids, who presents with abdominal and pelvic pain. Patient reports pelvic pain started about 5 days ago, she describes it as a dull ache and pressure that is constant, she had some discomfort with intercourse yesterday. She denies vaginal discharge or bleeding. She also reports epigastric abdominal pain that started yesterday, she describes this as intermittent burning sensation, this pain is associated with nausea and vomiting as well as a few episodes of diarrhea starting last night. She reports the epigastric pain is more intense than the pelvic pain. Diarrhea and emesis has been non-bloody, no melena or coffee grounds emesis. Patient reports she's had no appetite for the past few days, and has been unable to keep down fluids. 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 Patient denies fevers, chills, urinary symptoms, chest pain, shortness of breath. Tubal ligation in 1996, no other abdominal surgeries. Patient reports she is in monogamous relationship with her fianc, does not think she has been exposed to and STI, has had BV in the past.       Past Medical History:  Diagnosis Date  . Hypertension     There are no active problems to display for this patient.   Past Surgical History:  Procedure Laterality Date  . TUBAL LIGATION      OB History    No data available       Home Medications    Prior to Admission medications   Medication Sig Start Date End Date Taking? Authorizing Provider  lisinopril-hydrochlorothiazide (PRINZIDE,ZESTORETIC) 10-12.5 MG tablet Take 1 tablet by mouth daily. 10/18/15   Rolan Bucco, MD    Family History No family history on  file.  Social History Social History  Substance Use Topics  . Smoking status: Never Smoker  . Smokeless tobacco: Never Used  . Alcohol use No     Allergies   Shellfish allergy   Review of Systems Review of Systems  Constitutional: Positive for appetite change. Negative for chills and fever.  HENT: Negative for congestion, ear pain, rhinorrhea and sore throat.   Eyes: Negative for photophobia and visual disturbance.  Respiratory: Negative for cough, chest tightness and shortness of breath.   Cardiovascular: Negative for chest pain, palpitations and leg swelling.  Gastrointestinal: Positive for abdominal pain, diarrhea, nausea and vomiting. Negative for blood in stool.  Genitourinary: Positive for dyspareunia, pelvic pain and vaginal pain. Negative for difficulty urinating, dysuria, flank pain, hematuria, vaginal bleeding and vaginal discharge.  Musculoskeletal: Negative for arthralgias and myalgias.  Skin: Negative for pallor and rash.  Neurological: Negative for dizziness, weakness and headaches.     Physical Exam Updated Vital Signs BP (!) 128/94 (BP Location: Left Arm)   Pulse 78   Temp 98.8 F (37.1 C) (Oral)   Resp 18   Ht  (1.6 m)   Wt 79.4 kg (175 lb)   LMP 11/21/2016   SpO2 100%   BMI 31.00 kg/m   Physical Exam  Constitutional: She appears well-developed and well-nourished. No distress.  HENT:  Head: Normocephalic and atraumatic.  Eyes: Pupils are equal, round, and reactive to light. EOM are normal. Right eye exhibits no discharge. Left eye  exhibits no discharge.  Neck: Neck supple.  Cardiovascular: Normal rate, regular rhythm, normal heart sounds and intact distal pulses.   Pulmonary/Chest: Effort normal and breath sounds normal. No respiratory distress.  Abdominal: Soft. Bowel sounds are normal. She exhibits no mass. There is tenderness. There is no rebound and no guarding.  Tenderness to palpation in the epigastrium, no guarding. Slightly tender  suprapubically, no right upper quadrant tenderness, negative Murphy sign, no tenderness at McBurney's point, no rebound tenderness, no CVA tenderness  Genitourinary: No erythema, tenderness or bleeding in the vagina. Vaginal discharge found.  Genitourinary Comments: Some white discharge present in the vaginal vault, non-friable cervix with some tenderness and discomfort on bimanual exam, but no CMT, some discomfort on palpation of uterus and bilateral adnexa, no masses on palpation  Musculoskeletal: She exhibits no edema or deformity.  Neurological: She is alert. Coordination normal.  Speech is clear, able to follow commands Normal strength in upper and lower extremities bilaterally including dorsiflexion and plantar flexion, strong and equal grip strength Moves extremities without ataxia, coordination intact  Skin: Skin is warm and dry. Capillary refill takes less than 2 seconds. She is not diaphoretic. No pallor.  Psychiatric: She has a normal mood and affect. Her behavior is normal.  Nursing note and vitals reviewed.    ED Treatments / Results  Labs (all labs ordered are listed, but only abnormal results are displayed) Labs Reviewed  WET PREP, GENITAL - Abnormal; Notable for the following:       Result Value   Clue Cells Wet Prep HPF POC PRESENT (*)    WBC, Wet Prep HPF POC FEW (*)    All other components within normal limits  URINALYSIS, ROUTINE W REFLEX MICROSCOPIC - Abnormal; Notable for the following:    Color, Urine ORANGE (*)    APPearance TURBID (*)    Glucose, UA   (*)    Value: TEST NOT REPORTED DUE TO COLOR INTERFERENCE OF URINE PIGMENT   Hgb urine dipstick   (*)    Value: TEST NOT REPORTED DUE TO COLOR INTERFERENCE OF URINE PIGMENT   Bilirubin Urine   (*)    Value: TEST NOT REPORTED DUE TO COLOR INTERFERENCE OF URINE PIGMENT   Ketones, ur   (*)    Value: TEST NOT REPORTED DUE TO COLOR INTERFERENCE OF URINE PIGMENT   Protein, ur   (*)    Value: TEST NOT REPORTED DUE TO  COLOR INTERFERENCE OF URINE PIGMENT   Nitrite   (*)    Value: TEST NOT REPORTED DUE TO COLOR INTERFERENCE OF URINE PIGMENT   Leukocytes, UA   (*)    Value: TEST NOT REPORTED DUE TO COLOR INTERFERENCE OF URINE PIGMENT   All other components within normal limits  COMPREHENSIVE METABOLIC PANEL - Abnormal; Notable for the following:    Sodium 134 (*)    Potassium 2.7 (*)    Chloride 99 (*)    ALT 12 (*)    All other components within normal limits  CBC WITH DIFFERENTIAL/PLATELET - Abnormal; Notable for the following:    Hemoglobin 11.7 (*)    HCT 34.5 (*)    Neutro Abs 8.4 (*)    All other components within normal limits  URINALYSIS, MICROSCOPIC (REFLEX) - Abnormal; Notable for the following:    Bacteria, UA MANY (*)    Squamous Epithelial / LPF 0-5 (*)    All other components within normal limits  URINALYSIS, ROUTINE W REFLEX MICROSCOPIC - Abnormal; Notable for the following:  Specific Gravity, Urine <1.005 (*)    Hgb urine dipstick TRACE (*)    All other components within normal limits  URINALYSIS, MICROSCOPIC (REFLEX) - Abnormal; Notable for the following:    Bacteria, UA MANY (*)    Squamous Epithelial / LPF 0-5 (*)    All other components within normal limits  PREGNANCY, URINE  LIPASE, BLOOD  MAGNESIUM  RPR  HIV ANTIBODY (ROUTINE TESTING)  GC/CHLAMYDIA PROBE AMP ([place]) NOT AT [facility]    EKG  EKG Interpretation  Date/Time:  Tuesday December 10 2016 12:05:04 EDT Ventricular Rate:  85 PR Interval:  162 QRS Duration: 84 QT Interval:  370 QTC Calculation: 440 R Axis:   44 Text Interpretation:  Normal sinus rhythm Cannot rule out Inferior infarct , age undetermined Abnormal ECG Nonspecific T wave abnormality Confirmed by Benjiman Core [phone]) on 12/10/2016 12:46:51 PM       Radiology No results found.  Procedures Procedures (including critical care time)  Medications Ordered in ED Medications  ondansetron (ZOFRAN) injection 4 mg (4 mg Intravenous Given  12/10/16 1023)  sodium chloride 0.9 % bolus 1,000 mL (0 mLs Intravenous Stopped 12/10/16 1153)  ketorolac (TORADOL) 30 MG/ML injection 15 mg (15 mg Intravenous Given 12/10/16 1024)  potassium chloride 10 mEq in 100 mL IVPB (0 mEq Intravenous Stopped 12/10/16 1316)  potassium chloride SA (K-DUR,KLOR-CON) CR tablet 40 mEq (40 mEq Oral Given 12/10/16 1152)  gi cocktail (Maalox,Lidocaine,Donnatal) (30 mLs Oral Given 12/10/16 1153)     Initial Impression / Assessment and Plan / ED Course  I have reviewed the triage vital signs and the nursing notes.  Pertinent labs & imaging results that were available during my care of the patient were reviewed by me and considered in my medical decision making (see chart for details).  Patient presents with pelvic pain and epigastric abdominal pain with N/V/D. Vitals normal on initial eval. Patient is nontoxic, nonseptic appearing, in no apparent distress.  Patient's pain and other symptoms adequately managed in emergency department.  Fluid bolus given. Lab evaluation shows no leukocytosis, potassium is 2.7, electrolytes otherwise unremarkable, good kidney and liver function. UA was initially mixed up with another patient but repeat UA unconcering for infecion, no urinary symptoms. Wet prep shows clue cells, will treat with flagyl. Will replace potassium here in the ED.  On repeat exam pain is improved and patient does not have a surgical abdomen and there are no peritoneal signs. Nausea is resolved and patient is tolerating PO.  No indication of appendicitis, bowel obstruction, bowel perforation, cholecystitis, diverticulitis, PID or ectopic pregnancy. Will give GI cocktail.  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 Patient discharged home with flagyl for BV, K+ replacment and symptomatic treatment. Patient given strict instructions for follow-up with their primary care physician this week for potassium recheck and to  monitor improvement. Discussed that she will be contacted with the results of STI tests and treated for any positives, but at this time I do not think she needs to be empirically treated.  I have also discussed reasons to return immediately to the ER.  Patient expresses understanding and agrees with plan.   Final Clinical Impressions(s) / ED Diagnoses   Final diagnoses:  Generalized abdominal pain  Nausea vomiting and diarrhea  Bacterial vaginosis  Hypokalemia    New Prescriptions Discharge Medication List as of 12/10/2016  1:37 PM    START taking these medications   Details  dicyclomine (BENTYL) 20 MG tablet Take 1 tablet (20 mg total) by mouth 2 (two) times daily., Starting Tue 12/10/2016, Print    metroNIDAZOLE (FLAGYL) 500 MG tablet Take 1 tablet (500 mg total) by mouth 2 (two) times daily. One po bid x 7 days, Starting Tue 12/10/2016, Print    ondansetron (ZOFRAN ODT) 4 MG disintegrating tablet  ODT q4 hours prn nausea/vomit, Print    potassium chloride (K-DUR) 10 MEQ tablet Take 2 tablets (20 mEq total) by mouth daily., Starting Tue 12/10/2016, Print         Dartha Lodge, PA-C 12/10/16 1657    Benjiman Core, MD 12/13/16 [phone]

## 2016-12-10 NOTE — Discharge Instructions (Signed)
Your workup has been reassuring these symptoms are likely due to a virus, you may use Zofran and Bentyl to treat pain and nausea at home. Return to the ED if your feel you are getting worse or any of the scenarios below occur.   Your wet prep showed signs of BV, take full course of Flagyl as directed. You will be contacted if any results from other STI testing are positive.   Your potassium was low today continue to take 1 Potassium tablet a day as directed, and follow up with your Primary doctor in the next few days to have potassium rechecked and ensure other symptoms are improving.  Please seek immediate care if you have any develop any of the following symptoms: The pain does not go away.  You have a fever.  You keep throwing up (vomiting).  The pain is felt only in portions of the abdomen. Pain in the right side could possibly be appendicitis. In an adult, pain in the left lower portion of the abdomen could be colitis or diverticulitis.  You pass bloody or black tarry stools.  There is bright red blood in the stool.  The constipation stays for more than 4 days.  There is belly (abdominal) or rectal pain.  You do not seem to be getting better.  You have any questions or concerns.

## 2016-12-10 NOTE — ED Triage Notes (Signed)
Pt having abdominal pain and pelvic pain since yesterday.  Some N/V/D.  No vaginal discharge.  No fever.  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  Pt admits to painful sex last night as well.  Some odor.

## 2016-12-11 LAB — GC/CHLAMYDIA PROBE AMP (~~LOC~~) NOT AT ARMC
Chlamydia: NEGATIVE
NEISSERIA GONORRHEA: NEGATIVE

## 2016-12-11 LAB — HIV ANTIBODY (ROUTINE TESTING W REFLEX): HIV SCREEN 4TH GENERATION: NONREACTIVE

## 2016-12-11 LAB — RPR: RPR Ser Ql: NONREACTIVE

## 2017-03-23 ENCOUNTER — Emergency Department (HOSPITAL_BASED_OUTPATIENT_CLINIC_OR_DEPARTMENT_OTHER)
Admission: EM | Admit: 2017-03-23 | Discharge: 2017-03-23 | Disposition: A | Discharge status: Home / Self Care | Payer: No Typology Code available for payment source | Attending: Emergency Medicine | Admitting: Emergency Medicine

## 2017-03-23 ENCOUNTER — Encounter (HOSPITAL_BASED_OUTPATIENT_CLINIC_OR_DEPARTMENT_OTHER): Payer: Self-pay | Admitting: Emergency Medicine

## 2017-03-23 ENCOUNTER — Other Ambulatory Visit: Payer: Self-pay

## 2017-03-23 ENCOUNTER — Emergency Department (HOSPITAL_BASED_OUTPATIENT_CLINIC_OR_DEPARTMENT_OTHER): Payer: No Typology Code available for payment source

## 2017-03-23 DIAGNOSIS — Y999 Unspecified external cause status: Secondary | ICD-10-CM | POA: Diagnosis not present

## 2017-03-23 DIAGNOSIS — M25551 Pain in right hip: Secondary | ICD-10-CM | POA: Diagnosis not present

## 2017-03-23 DIAGNOSIS — W01198A Fall on same level from slipping, tripping and stumbling with subsequent striking against other object, initial encounter: Secondary | ICD-10-CM | POA: Diagnosis not present

## 2017-03-23 DIAGNOSIS — I10 Essential (primary) hypertension: Secondary | ICD-10-CM | POA: Diagnosis not present

## 2017-03-23 DIAGNOSIS — Z79899 Other long term (current) drug therapy: Secondary | ICD-10-CM | POA: Insufficient documentation

## 2017-03-23 DIAGNOSIS — W19XXXA Unspecified fall, initial encounter: Secondary | ICD-10-CM

## 2017-03-23 DIAGNOSIS — Y9389 Activity, other specified: Secondary | ICD-10-CM | POA: Insufficient documentation

## 2017-03-23 DIAGNOSIS — Y92003 Bedroom of unspecified non-institutional (private) residence as the place of occurrence of the external cause: Secondary | ICD-10-CM | POA: Diagnosis not present

## 2017-03-23 DIAGNOSIS — S79911A Unspecified injury of right hip, initial encounter: Secondary | ICD-10-CM | POA: Diagnosis present

## 2017-03-23 LAB — PREGNANCY, URINE: PREG TEST UR: NEGATIVE

## 2017-03-23 MED ORDER — METHOCARBAMOL 500 MG PO TABS: 500.0000 mg | ORAL_TABLET | Freq: Three times a day (TID) | ORAL | 0 refills | Status: DC | PRN

## 2017-03-23 MED ORDER — IBUPROFEN 800 MG PO TABS: 800.0000 mg | ORAL_TABLET | Freq: Three times a day (TID) | ORAL | 0 refills | Status: AC | PRN

## 2017-03-23 MED ORDER — IBUPROFEN 800 MG PO TABS
800.0000 mg | ORAL_TABLET | Freq: Once | ORAL | Status: AC
  Administered 2017-03-23: 800 mg via ORAL
  Filled 2017-03-23: qty 1

## 2017-03-23 NOTE — Discharge Instructions (Signed)

## 2017-03-23 NOTE — ED Provider Notes (Signed)
Emergency Department Provider Note   I have reviewed the triage vital signs and the nursing notes.   HISTORY  Chief Complaint Fall   HPI Barbara Fox is a 46 y.o. female with PMH of HTN presents emergency department for evaluation of right leg pain after fall yesterday.  The patient was sitting at her cabinet when she fell to the right when trying to stand up.  Patient states that she frequently loses her balance and finds herself to be somewhat clumsy.  She has had pain in the right hip and thigh area since last night.  This morning she was unable to get out of bed because of pain in the leg.  She is noticed some mild swelling in the leg.  No numbness or tingling in the right lower extremity.  Patient did not hit her head or injure her arm.   Past Medical History:  Diagnosis Date  . Hypertension     There are no active problems to display for this patient.   Past Surgical History:  Procedure Laterality Date  . TUBAL LIGATION      Current Outpatient Rx  . Order #: [id number] Class: Print  . Order #: [id number] Class: Print  . Order #: [id number] Class: Print  . Order #: [id number] Class: Print  . Order #: [id number] Class: Print  . Order #: [id number] Class: Print  . Order #: [id number] Class: Print    Allergies Shellfish allergy  No family history on file.  Social History Social History   Tobacco Use  . Smoking status: Never Smoker  . Smokeless tobacco: Never Used  Substance Use Topics  . Alcohol use: Yes    Comment: socially  . Drug use: No    Review of Systems  Constitutional: No fever/chills Eyes: No visual changes. ENT: No sore throat. Cardiovascular: Denies chest pain. Respiratory: Denies shortness of breath. Gastrointestinal: No abdominal pain.  No nausea, no vomiting.  No diarrhea.  No constipation. Genitourinary: Negative for dysuria. Musculoskeletal: Negative for back pain. Positive right leg pain.  Skin: Negative for rash. Neurological: Negative  for headaches, focal weakness or numbness.  10-point ROS otherwise negative.  ____________________________________________   PHYSICAL EXAM:  VITAL SIGNS: ED Triage Vitals  Enc Vitals Group     BP 03/23/17 0956 114/75     Pulse Rate 03/23/17 0956 87     Resp 03/23/17 0956 16     Temp 03/23/17 0956 98.5 F (36.9 C)     Temp Source 03/23/17 0956 Oral     SpO2 03/23/17 0956 100 %     Weight 03/23/17 1010 179 lb (81.2 kg)     Height 03/23/17 1010 5\' 3"  (1.6 m)     Pain Score 03/23/17 1009 6   Constitutional: Alert and oriented. Well appearing and in no acute distress. Eyes: Conjunctivae are normal.  Head: Atraumatic. Nose: No congestion/rhinnorhea. Mouth/Throat: Mucous membranes are moist.  Neck: No stridor.   Cardiovascular: Normal rate, regular rhythm. Good peripheral circulation. Grossly normal heart sounds.   Respiratory: Normal respiratory effort.  No retractions. Lungs CTAB. Gastrointestinal: Soft and nontender. No distention.  Musculoskeletal: Moderate pain with ROM of the right hip. No bruising or significant swelling of the right hip. No knee pain or swelling. Normal right ankle exam. Normal ROM of the remaining extremities.  Neurologic:  Normal speech and language. No gross focal neurologic deficits are appreciated.  Skin:  Skin is warm, dry and intact. No rash noted.  ____________________________________________   LABS (all labs ordered are listed, but only abnormal  results are displayed)  Labs Reviewed  PREGNANCY, URINE   ____________________________________________  RADIOLOGY  EXAM:  DG HIP (WITH OR WITHOUT PELVIS) 2-3 V RIGHT    COMPARISON: 11/20/2008    FINDINGS:  No acute fracture. No dislocation. Unremarkable soft tissues.    IMPRESSION:  No acute bony pathology.      Electronically Signed  By: Jolaine ClickArthur Hoss M.D.  On: 03/23/2017 10:52    ____________________________________________   PROCEDURES  Procedure(s) performed:    Procedures  None ____________________________________________   INITIAL IMPRESSION / ASSESSMENT AND PLAN / ED COURSE  Pertinent labs & imaging results that were available during my care of the patient were reviewed by me and considered in my medical decision making (see chart for details).  Patient presents to the emergency department for evaluation of right leg pain after fall.  Patient has moderate discomfort with passive range of motion of the right hip.  Plan for x-ray of this area to rule out fracture. Patient will be given 800 mg Motrin in the ED.   Plain film negative. Offered crutches for WBAT but patient does not want them. Motrin provided. Sports Med follow up info provided to schedule appointment as needed.   At this time, I do not feel there is any life-threatening condition present. I have reviewed and discussed all results (EKG, imaging, lab, urine as appropriate), exam findings with patient. I have reviewed nursing notes and appropriate previous records.  I feel the patient is safe to be discharged home without further emergent workup. Discussed usual and customary return precautions. Patient and family (if present) verbalize understanding and are comfortable with this plan.  Patient will follow-up with their primary care provider. If they do not have a primary care provider, information for follow-up has been provided to them. All questions have been answered.  ____________________________________________  FINAL CLINICAL IMPRESSION(S) / ED DIAGNOSES  Final diagnoses:  Fall, initial encounter  Right hip pain     MEDICATIONS GIVEN DURING THIS VISIT:  Medications  ibuprofen (ADVIL,MOTRIN) tablet 800 mg (800 mg Oral Given 03/23/17 1047)     NEW OUTPATIENT MEDICATIONS STARTED DURING THIS VISIT:  Motrin 800 mg and Robaxin   Note:  This document was prepared using Dragon voice recognition software and may include unintentional dictation errors.  Alona BeneJoshua Janeliz Prestwood,  MD Emergency Medicine    Barbara Fox, Arlyss RepressJoshua G, MD 03/24/17 [phone]

## 2017-03-23 NOTE — ED Triage Notes (Signed)
"   I was bending down trying to put something in the sink cabinet and I fell over when I was trying to stand up" last night. No LOC,  landed on right side, pain to right hip and thigh area.

## 2017-07-28 ENCOUNTER — Encounter (HOSPITAL_BASED_OUTPATIENT_CLINIC_OR_DEPARTMENT_OTHER): Payer: Self-pay | Admitting: Emergency Medicine

## 2017-07-28 ENCOUNTER — Emergency Department (HOSPITAL_BASED_OUTPATIENT_CLINIC_OR_DEPARTMENT_OTHER): Payer: BLUE CROSS/BLUE SHIELD

## 2017-07-28 ENCOUNTER — Other Ambulatory Visit: Payer: Self-pay

## 2017-07-28 ENCOUNTER — Emergency Department (HOSPITAL_BASED_OUTPATIENT_CLINIC_OR_DEPARTMENT_OTHER)
Admission: EM | Admit: 2017-07-28 | Discharge: 2017-07-29 | Disposition: A | Discharge status: Home / Self Care | Payer: BLUE CROSS/BLUE SHIELD | Attending: Emergency Medicine | Admitting: Emergency Medicine

## 2017-07-28 DIAGNOSIS — R110 Nausea: Secondary | ICD-10-CM | POA: Insufficient documentation

## 2017-07-28 DIAGNOSIS — R3915 Urgency of urination: Secondary | ICD-10-CM | POA: Insufficient documentation

## 2017-07-28 DIAGNOSIS — E876 Hypokalemia: Secondary | ICD-10-CM

## 2017-07-28 DIAGNOSIS — I10 Essential (primary) hypertension: Secondary | ICD-10-CM | POA: Diagnosis not present

## 2017-07-28 DIAGNOSIS — Z79899 Other long term (current) drug therapy: Secondary | ICD-10-CM | POA: Insufficient documentation

## 2017-07-28 DIAGNOSIS — R1031 Right lower quadrant pain: Secondary | ICD-10-CM | POA: Diagnosis present

## 2017-07-28 DIAGNOSIS — T502X5A Adverse effect of carbonic-anhydrase inhibitors, benzothiadiazides and other diuretics, initial encounter: Secondary | ICD-10-CM

## 2017-07-28 LAB — COMPREHENSIVE METABOLIC PANEL
ALT: 11 U/L — ABNORMAL LOW (ref 14–54)
AST: 21 U/L (ref 15–41)
Albumin: 4 g/dL (ref 3.5–5.0)
Alkaline Phosphatase: 47 U/L (ref 38–126)
Anion gap: 12 (ref 5–15)
BUN: 14 mg/dL (ref 6–20)
CALCIUM: 9.3 mg/dL (ref 8.9–10.3)
CHLORIDE: 100 mmol/L — AB (ref 101–111)
CO2: 22 mmol/L (ref 22–32)
Creatinine, Ser: 0.85 mg/dL (ref 0.44–1.00)
GFR calc non Af Amer: >60 mL/min (ref >60)
Glucose, Bld: 81 mg/dL (ref 65–99)
Potassium: 3.4 mmol/L — ABNORMAL LOW (ref 3.5–5.1)
SODIUM: 134 mmol/L — AB (ref 135–145)
Total Bilirubin: 0.6 mg/dL (ref 0.3–1.2)
Total Protein: 7.5 g/dL (ref 6.5–8.1)

## 2017-07-28 LAB — CBC
HCT: 37.2 % (ref 36.0–46.0)
Hemoglobin: 13 g/dL (ref 12.0–15.0)
MCH: 29.1 pg (ref 26.0–34.0)
MCHC: 34.9 g/dL (ref 30.0–36.0)
MCV: 83.2 fL (ref 78.0–100.0)
PLATELETS: 374 10*3/uL (ref 150–400)
RBC: 4.47 MIL/uL (ref 3.87–5.11)
RDW: 13 % (ref 11.5–15.5)
WBC: 6.9 10*3/uL (ref 4.0–10.5)

## 2017-07-28 LAB — URINALYSIS, ROUTINE W REFLEX MICROSCOPIC
BILIRUBIN URINE: NEGATIVE
GLUCOSE, UA: NEGATIVE mg/dL
Ketones, ur: NEGATIVE mg/dL
Leukocytes, UA: NEGATIVE
Nitrite: NEGATIVE
Protein, ur: NEGATIVE mg/dL
pH: 6 (ref 5.0–8.0)

## 2017-07-28 LAB — URINALYSIS, MICROSCOPIC (REFLEX)

## 2017-07-28 LAB — LIPASE, BLOOD: LIPASE: 28 U/L (ref 11–51)

## 2017-07-28 LAB — PREGNANCY, URINE: PREG TEST UR: NEGATIVE

## 2017-07-28 MED ORDER — SODIUM CHLORIDE 0.9 % IV BOLUS
1000.0000 mL | Freq: Once | INTRAVENOUS | Status: AC
Start: 2017-07-28 — End: 2017-07-29
  Administered 2017-07-29: 1000 mL via INTRAVENOUS

## 2017-07-28 NOTE — ED Triage Notes (Signed)
RLQ abd pain x 6 hours with nausea.

## 2017-07-28 NOTE — ED Provider Notes (Addendum)
[facility] EMERGENCY DEPARTMENT Provider Note   CSN: [id number] Arrival date & time: 07/28/17  2023     History   Chief Complaint Chief Complaint  Patient presents with  . Abdominal Pain    HPI Barbara Fox is a 47 y.o. female.  The history is provided by the patient.  She has a history of hypertension and comes in with right lower quadrant abdominal pain.  Pain started about 3 PM and was sharp and crampy.  Initially pain was rated at 5/10.  It has been coming about every 2 minutes and gradually increasing in intensity.  Since about 5 PM, the pain has been constant and she now rates it at 8/10.  It is slightly worse when she bends forward.  Nothing makes it better.  There has been mild nausea but no vomiting.  She denies fever, chills, sweats.  She has had some urinary urgency but no frequency or tenesmus or dysuria.  She is just coming off of her menses.  She is status post tubal ligation.  She denies having had similar pain in the past.  Past Medical History:  Diagnosis Date  . Hypertension     There are no active problems to display for this patient.   Past Surgical History:  Procedure Laterality Date  . TUBAL LIGATION       OB History   None      Home Medications    Prior to Admission medications   Medication Sig Start Date End Date Taking? Authorizing Provider  dicyclomine (BENTYL) 20 MG tablet Take 1 tablet (20 mg total) by mouth 2 (two) times daily. 12/10/16  Yes Dartha Lodge, PA-C  ibuprofen (ADVIL,MOTRIN) 800 MG tablet Take 1 tablet (800 mg total) by mouth every 8 (eight) hours as needed. 03/23/17  Yes Long, Arlyss Repress, MD  methocarbamol (ROBAXIN) 500 MG tablet Take 1 tablet (500 mg total) by mouth every 8 (eight) hours as needed for muscle spasms. 03/23/17  Yes Long, Arlyss Repress, MD  metroNIDAZOLE (FLAGYL) 500 MG tablet Take 1 tablet (500 mg total) by mouth 2 (two) times daily. One po bid x 7 days 12/10/16  Yes Dartha Lodge, PA-C  ondansetron  (ZOFRAN ODT) 4 MG disintegrating tablet  ODT q4 hours prn nausea/vomit 12/10/16  Yes Ford, Arva Chafe, PA-C  potassium chloride (K-DUR) 10 MEQ tablet Take 2 tablets (20 mEq total) by mouth daily. 12/10/16  Yes Dartha Lodge, PA-C  lisinopril-hydrochlorothiazide (PRINZIDE,ZESTORETIC) 10-12.5 MG tablet Take 1 tablet by mouth daily. 10/18/15   Rolan Bucco, MD    Family History No family history on file.  Social History Social History   Tobacco Use  . Smoking status: Never Smoker  . Smokeless tobacco: Never Used  Substance Use Topics  . Alcohol use: Yes    Comment: socially  . Drug use: No     Allergies   Shellfish allergy   Review of Systems Review of Systems  All other systems reviewed and are negative.    Physical Exam Updated Vital Signs BP 134/82 (BP Location: Right Arm)   Pulse 71   Temp 98.2 F (36.8 C) (Oral)   Resp 18   Ht  (1.6 m)   Wt 74.8 kg (165 lb)   LMP 07/24/2017   SpO2 100%   BMI 29.23 kg/m   Physical Exam  Nursing note and vitals reviewed.  47 year old female, resting comfortably and in no acute distress. Vital signs are normal. Oxygen saturation is  100%, which is normal. Head is normocephalic and atraumatic. PERRLA, EOMI. Oropharynx is clear. Neck is nontender and supple without adenopathy or JVD. Back is nontender and there is no CVA tenderness. Lungs are clear without rales, wheezes, or rhonchi. Chest is nontender. Heart has regular rate and rhythm without murmur. Abdomen is soft, flat, with mild to moderate tenderness fairly well localized in the right mid abdomen.  There is no rebound or guarding.  There are no masses or hepatosplenomegaly and peristalsis is normoactive. Extremities have no cyanosis or edema, full range of motion is present. Skin is warm and dry without rash. Neurologic: Mental status is normal, cranial nerves are intact, there are no motor or sensory deficits.  ED Treatments / Results  Labs (all labs ordered are  listed, but only abnormal results are displayed) Labs Reviewed  COMPREHENSIVE METABOLIC PANEL - Abnormal; Notable for the following components:      Result Value   Sodium 134 (*)    Potassium 3.4 (*)    Chloride 100 (*)    ALT 11 (*)    All other components within normal limits  URINALYSIS, ROUTINE W REFLEX MICROSCOPIC - Abnormal; Notable for the following components:   Specific Gravity, Urine >1.030 (*)    Hgb urine dipstick LARGE (*)    All other components within normal limits  URINALYSIS, MICROSCOPIC (REFLEX) - Abnormal; Notable for the following components:   Bacteria, UA RARE (*)    All other components within normal limits  LIPASE, BLOOD  CBC  PREGNANCY, URINE   Radiology Ct Abdomen Pelvis W Contrast  Result Date: 07/29/2017 CLINICAL DATA:  47 year old female with right lower quadrant abdominal pain, nausea and hematuria. EXAM: CT ABDOMEN AND PELVIS WITH CONTRAST TECHNIQUE: Multidetector CT imaging of the abdomen and pelvis was performed using the standard protocol following bolus administration of intravenous contrast. CONTRAST:  ISOVUE-300 IOPAMIDOL (ISOVUE-300) INJECTION 61% COMPARISON:  Pelvic ultrasound dated 08/17/2009 FINDINGS: Lower chest: The visualized lung bases are clear. No intra-abdominal free air or free fluid. Hepatobiliary: No focal liver abnormality is seen. No gallstones, gallbladder wall thickening, or biliary dilatation. Pancreas: Unremarkable. No pancreatic ductal dilatation or surrounding inflammatory changes. Spleen: Normal in size without focal abnormality. Adrenals/Urinary Tract: The adrenal glands are unremarkable. There are multiple small nonobstructing right renal calculi which may be parenchymal and dystrophic calcification. 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 There is resulting mild fullness of the right renal collecting system.  The left kidney is unremarkable. The visualized ureters and urinary bladder appear unremarkable. Stomach/Bowel: There is no bowel obstruction or active inflammation. Moderate colonic stool burden. The appendix is normal. Vascular/Lymphatic: The abdominal aorta and IVC appear unremarkable. No portal venous gas. There is no adenopathy. Reproductive: Enlarged myomatous uterus. The ovaries are grossly unremarkable. Other: Small fat containing umbilical hernia. Musculoskeletal: No acute or significant osseous findings. IMPRESSION: 1. No acute intra-abdominopelvic pathology. No bowel obstruction or active inflammation. Normal appendix. 2. 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 Multiple tiny parenchymal appearing foci of dystrophic calcification versus nonobstructing stones. 3. Enlarged myomatous uterus. Electronically Signed   By: Elgie Collard M.D.   On: 07/29/2017 02:06    Procedures Procedures   Medications Ordered in ED Medications  potassium chloride SA (K-DUR,KLOR-CON) CR tablet 40 mEq (has no administration in time range)  sodium chloride 0.9 % bolus 1,000 mL (0 mLs  Intravenous Stopped 07/29/17 0150)  iopamidol (ISOVUE-300) 61 % injection 100 mL (100 mLs Intravenous Contrast Given 07/29/17 0056)     Initial Impression / Assessment and Plan / ED Course  I have reviewed the triage vital signs and the nursing notes.  Pertinent labs & imaging results that were available during my care of the patient were reviewed by me and considered in my medical decision making (see chart for details).  Abdominal pain of uncertain cause.  Possible appendicitis, possible urolithiasis, possible diverticulitis, possible ovarian cyst.  Urinalysis shows no evidence of urinary tract infection, but hematuria is present which is probably related to patient currently on her menses.  Lipase is normal making pancreatitis unlikely.   Initial labs are unremarkable.  Mild hypokalemia which is secondary to diuretic use.  Patient admits to not taking her supplemental potassium for the last several months.  Old records are reviewed, and she does have one prior ED visit for abdominal pain.  She will be sent for CT of abdomen and pelvis.  She will be driving herself home, and has elected to delay analgesics until I diagnosis is made.  CT scan shows enlarged uterus with uterine fibroids, evidence of scarring of the right kidney from prior episodes of pyelonephritis, but no evidence of acute process.  She is discharged with prescription for a small number of oxycodone-acetaminophen tablets and advised to return if pain is worsening or not improving after 24 hours.  Also, advised to resume taking her potassium supplement.  Final Clinical Impressions(s) / ED Diagnoses   Final diagnoses:  Right lower quadrant pain  Diuretic-induced hypokalemia    ED Discharge Orders        Ordered    oxyCODONE-acetaminophen (PERCOCET) 5-325 MG tablet  Every 4 hours PRN     07/29/17 0224    potassium chloride (K-DUR) 10 MEQ tablet  Daily     07/29/17 0226       Dione Booze, MD 07/29/17 1610    Dione Booze, MD 07/29/17 [id number]

## 2017-07-29 MED ORDER — POTASSIUM CHLORIDE CRYS ER 20 MEQ PO TBCR
40.0000 meq | EXTENDED_RELEASE_TABLET | Freq: Once | ORAL | Status: AC
  Administered 2017-07-29: 40 meq via ORAL
  Filled 2017-07-29: qty 2

## 2017-07-29 MED ORDER — POTASSIUM CHLORIDE ER 10 MEQ PO TBCR: 20.0000 meq | EXTENDED_RELEASE_TABLET | Freq: Every day | ORAL | 0 refills | Status: DC

## 2017-07-29 MED ORDER — IOPAMIDOL (ISOVUE-300) INJECTION 61%
100.0000 mL | Freq: Once | INTRAVENOUS | Status: AC | PRN
  Administered 2017-07-29: 100 mL via INTRAVENOUS

## 2017-07-29 MED ORDER — OXYCODONE-ACETAMINOPHEN 5-325 MG PO TABS: 1.0000 | ORAL_TABLET | ORAL | 0 refills | Status: DC | PRN

## 2017-07-29 NOTE — ED Notes (Signed)
Alert, NAD, calm, interactive, resps e/u, speaking in clear complete sentences, no dyspnea noted, skin W&D, mentions RLQ pain, also some nausea, (denies: sob, dizziness or visual changes).

## 2017-07-29 NOTE — Discharge Instructions (Signed)
Your CT scan did not show a clear cause for your pain.  However, many things may not show up early on.  If pain is getting worse, or if it is not improving after 24 hours, you should come back for recheck.  Your potassium is slightly low today.  This is because you are taking a medication (lisinopril-hydrochlorothiazide) which makes you lose potassium.  Please resume taking your potassium supplement and take it every day.

## 2017-07-29 NOTE — ED Notes (Signed)
Patient transported to CT 

## 2018-04-06 ENCOUNTER — Ambulatory Visit
Admission: EM | Admit: 2018-04-06 | Discharge: 2018-04-06 | Disposition: A | Discharge status: Home / Self Care | Payer: BLUE CROSS/BLUE SHIELD | Attending: Family Medicine | Admitting: Family Medicine

## 2018-04-06 ENCOUNTER — Other Ambulatory Visit: Payer: Self-pay

## 2018-04-06 DIAGNOSIS — R350 Frequency of micturition: Secondary | ICD-10-CM | POA: Insufficient documentation

## 2018-04-06 LAB — POCT URINALYSIS DIP (MANUAL ENTRY)
BILIRUBIN UA: NEGATIVE
BILIRUBIN UA: NEGATIVE mg/dL
GLUCOSE UA: NEGATIVE mg/dL
Leukocytes, UA: NEGATIVE
Nitrite, UA: NEGATIVE
Protein Ur, POC: NEGATIVE mg/dL
RBC UA: NEGATIVE
Spec Grav, UA: 1.02 (ref 1.010–1.025)
Urobilinogen, UA: 0.2 E.U./dL
pH, UA: 7.5 (ref 5.0–8.0)

## 2018-04-06 MED ORDER — CEPHALEXIN 500 MG PO CAPS: 500.0000 mg | ORAL_CAPSULE | Freq: Two times a day (BID) | ORAL | 0 refills | Status: DC

## 2018-04-06 NOTE — ED Triage Notes (Signed)
Pt c/o urinary frequency, bladder distension and lower back pain x1wk

## 2018-04-06 NOTE — ED Provider Notes (Addendum)
[facility]    CSN: [id number] Arrival date & time: 04/06/18  1507     History   Chief Complaint Chief Complaint  Patient presents with  . Urinary Tract Infection    HPI Barbara Fox is a 48 y.o. female.   HPI  Just got over a case of influenza a week ago and now is sick again.  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 Suprapubic pressure and low back pain   NO nausea or vomiting.  NO fever or chills.  No vaginal discharge or rash.  Had a BTL in 1996.  irreg menses due to fibroids. Never had kidney stone or infection Not usually prone to UTI    Past Medical History:  Diagnosis Date  . Hypertension     There are no active problems to display for this patient.   Past Surgical History:  Procedure Laterality Date  . TUBAL LIGATION      OB History   No obstetric history on file.      Home Medications    Prior to Admission medications   Medication Sig Start Date End Date Taking? Authorizing Provider  cephALEXin (KEFLEX) 500 MG capsule Take 1 capsule (500 mg total) by mouth 2 (two) times daily. 04/06/18   Eustace MooreNelson, Dejaun Vidrio Sue, MD  ibuprofen (ADVIL,MOTRIN) 800 MG tablet Take 1 tablet (800 mg total) by mouth every 8 (eight) hours as needed. 03/23/17   Long, Arlyss RepressJoshua G, MD  lisinopril-hydrochlorothiazide (PRINZIDE,ZESTORETIC) 10-12.5 MG tablet Take 1 tablet by mouth daily. 10/18/15   Rolan BuccoBelfi, Melanie, MD  potassium chloride (K-DUR) 10 MEQ tablet Take 2 tablets (20 mEq total) by mouth daily. 07/29/17   Dione BoozeGlick, David, MD    Family History No family history on file.  Social History Social History   Tobacco Use  . Smoking status: Never Smoker  . Smokeless tobacco: Never Used  Substance Use Topics  . Alcohol use: Yes    Comment: socially  . Drug use: No     Allergies   Shellfish allergy   Review of Systems Review of Systems  Constitutional: Negative for  chills and fever.  HENT: Negative for ear pain and sore throat.   Eyes: Negative for pain and visual disturbance.  Respiratory: Negative for cough and shortness of breath.   Cardiovascular: Negative for chest pain and palpitations.  Gastrointestinal: Negative for abdominal pain and vomiting.  Genitourinary: Positive for difficulty urinating, dysuria, flank pain and frequency. Negative for decreased urine volume, hematuria, menstrual problem, pelvic pain, vaginal bleeding, vaginal discharge and vaginal pain.  Musculoskeletal: Positive for back pain. Negative for arthralgias.  Skin: Negative for color change and rash.  Neurological: Negative for seizures and syncope.  All other systems reviewed and are negative.    Physical Exam Triage Vital Signs ED Triage Vitals [04/06/18 1515]  Enc Vitals Group     BP 132/88     Pulse Rate 90     Resp 18     Temp 98.2 F (36.8 C)     Temp Source Oral     SpO2 99 %     Weight      Height      Head Circumference      Peak Flow      Pain Score 4     Pain Loc      Pain Edu?      Excl. in GC?  No data found.  Updated Vital Signs BP 132/88 (BP Location: Left Arm)   Pulse 90   Temp 98.2 F (36.8 C) (Oral)   Resp 18   LMP 03/17/2018   SpO2 99%   Visual Acuity Right Eye Distance:   Left Eye Distance:   Bilateral Distance:    Right Eye Near:   Left Eye Near:    Bilateral Near:     Physical Exam Constitutional:      General: She is not in acute distress.    Appearance: Normal appearance. She is well-developed and normal weight. She is not ill-appearing.  HENT:     Head: Normocephalic and atraumatic.     Right Ear: Tympanic membrane, ear canal and external ear normal.     Left Ear: Tympanic membrane, ear canal and external ear normal.     Nose: Nose normal. No congestion.     Mouth/Throat:     Mouth: Mucous membranes are moist.  Eyes:     Conjunctiva/sclera: Conjunctivae normal.     Pupils: Pupils are equal, round, and  reactive to light.  Neck:     Musculoskeletal: Normal range of motion.  Cardiovascular:     Rate and Rhythm: Normal rate and regular rhythm.     Heart sounds: Normal heart sounds.  Pulmonary:     Effort: Pulmonary effort is normal. No respiratory distress.     Breath sounds: Normal breath sounds. No rhonchi.     Comments: No CVAT Abdominal:     General: Abdomen is flat. There is no distension.     Palpations: Abdomen is soft.     Tenderness: There is abdominal tenderness.     Comments: Mild tenderness to deep palpation RLQ  Musculoskeletal: Normal range of motion.        General: No signs of injury.  Lymphadenopathy:     Cervical: No cervical adenopathy.  Skin:    General: Skin is warm and dry.  Neurological:     General: No focal deficit present.     Mental Status: She is alert. Mental status is at baseline.  Psychiatric:        Mood and Affect: Mood normal.        Thought Content: Thought content normal.      UC Treatments / Results  Labs (all labs ordered are listed, but only abnormal results are displayed) Labs Reviewed  URINE CULTURE  POCT URINALYSIS DIP (MANUAL ENTRY)    EKG None  Radiology No results found.  Procedures Procedures (including critical care time)  Medications Ordered in UC Medications - No data to display  Initial Impression / Assessment and Plan / UC Course  I have reviewed the triage vital signs and the nursing notes.  Pertinent labs & imaging results that were available during my care of the patient were reviewed by me and considered in my medical decision making (see chart for details).     Discussed normal UA.  Symptoms most suggestive of UTI. Will culture urine and treat.  See PCP if fails to improve.  Return if worse, fever, increased pain Final Clinical Impressions(s) / UC Diagnoses   Final diagnoses:  Urine frequency     Discharge Instructions     Push fluids/water Take the cephalexin 2 x a day Take 2 doses today See  your PCP if not improving in a couple of days We did lab testing during this visit.  If there are any abnormal findings that require change in medicine or indicate a  positive result, you will be notified.  If all of your tests are normal, you will not be called.   Ibuprofen or acetaminophen for pain   ED Prescriptions    Medication Sig Dispense Auth. Provider   cephALEXin (KEFLEX) 500 MG capsule Take 1 capsule (500 mg total) by mouth 2 (two) times daily. 14 capsule Eustace Moore, MD     Controlled Substance Prescriptions [place] Controlled Substance Registry consulted? Not Applicable   Eustace Moore, MD 04/06/18 1549    Eustace Moore, MD 04/06/18 1550

## 2018-04-06 NOTE — Discharge Instructions (Addendum)
Push fluids/water Take the cephalexin 2 x a day Take 2 doses today See your PCP if not improving in a couple of days We did lab testing during this visit.  If there are any abnormal findings that require change in medicine or indicate a positive result, you will be notified.  If all of your tests are normal, you will not be called.   Ibuprofen or acetaminophen for pain

## 2018-04-08 LAB — URINE CULTURE: CULTURE: NO GROWTH

## 2018-05-23 IMAGING — CR DG HIP (WITH OR WITHOUT PELVIS) 2-3V*R*
3 series · 3 of 3 positions shown · non-contrast
Comparison: 11/20/2008

CLINICAL DATA: Fall

EXAM:
DG HIP (WITH OR WITHOUT PELVIS) 2-3 V RIGHT

[t pelvis a.p.]
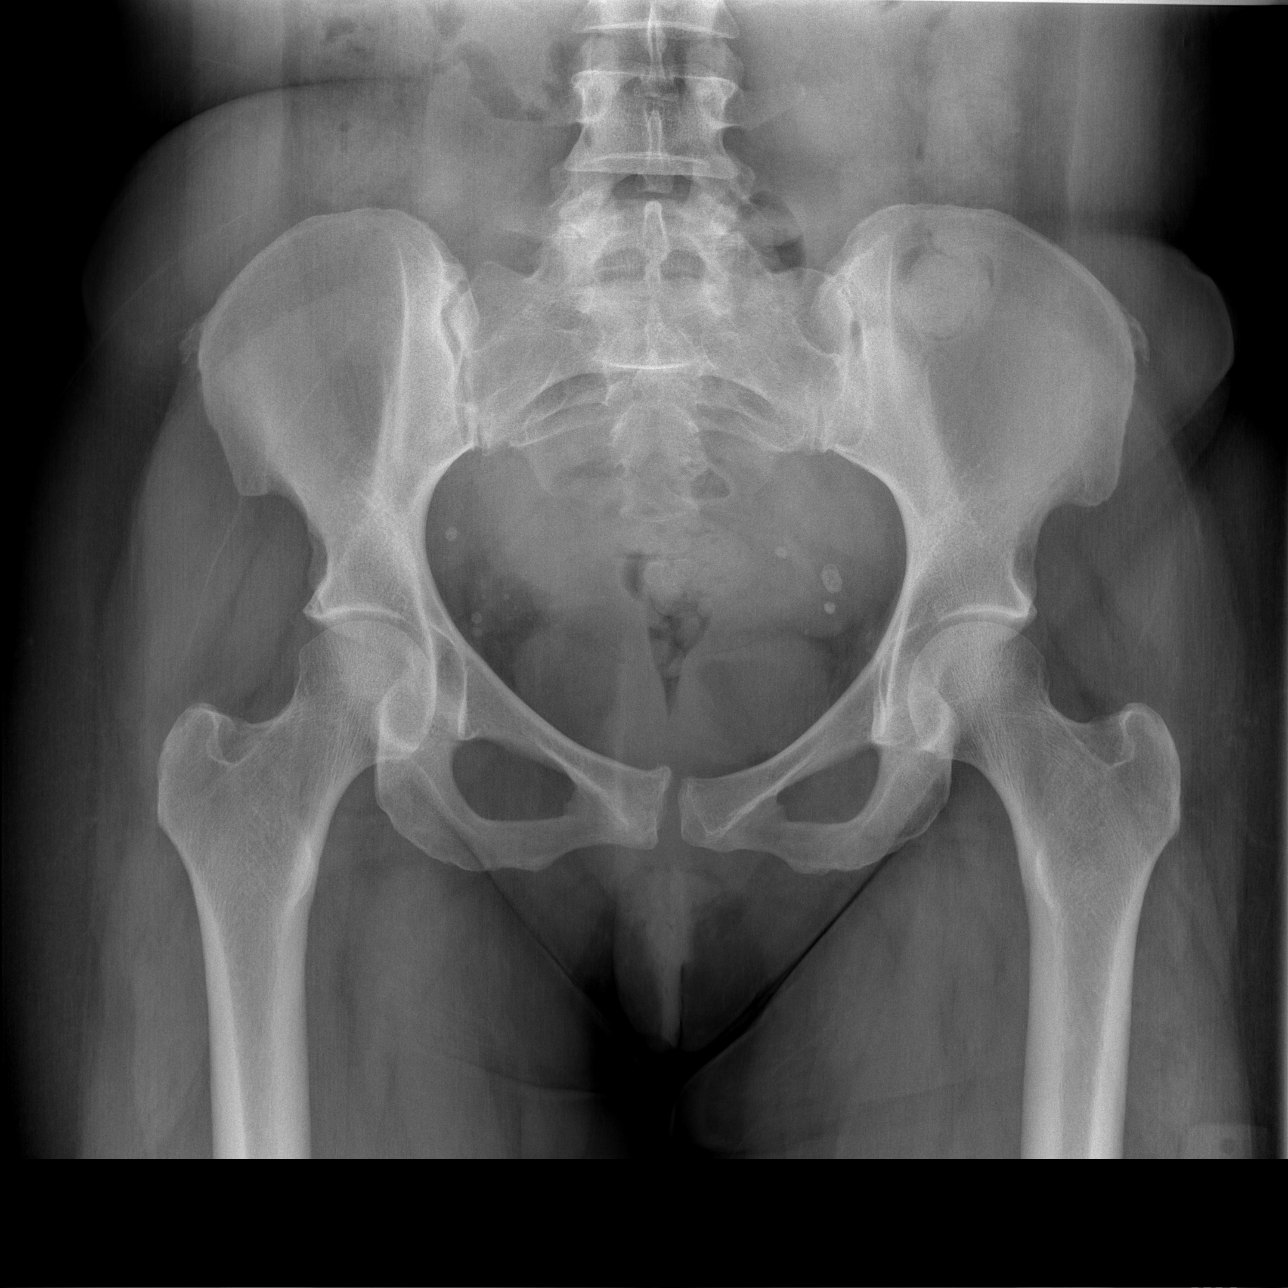

[t hip ap right]
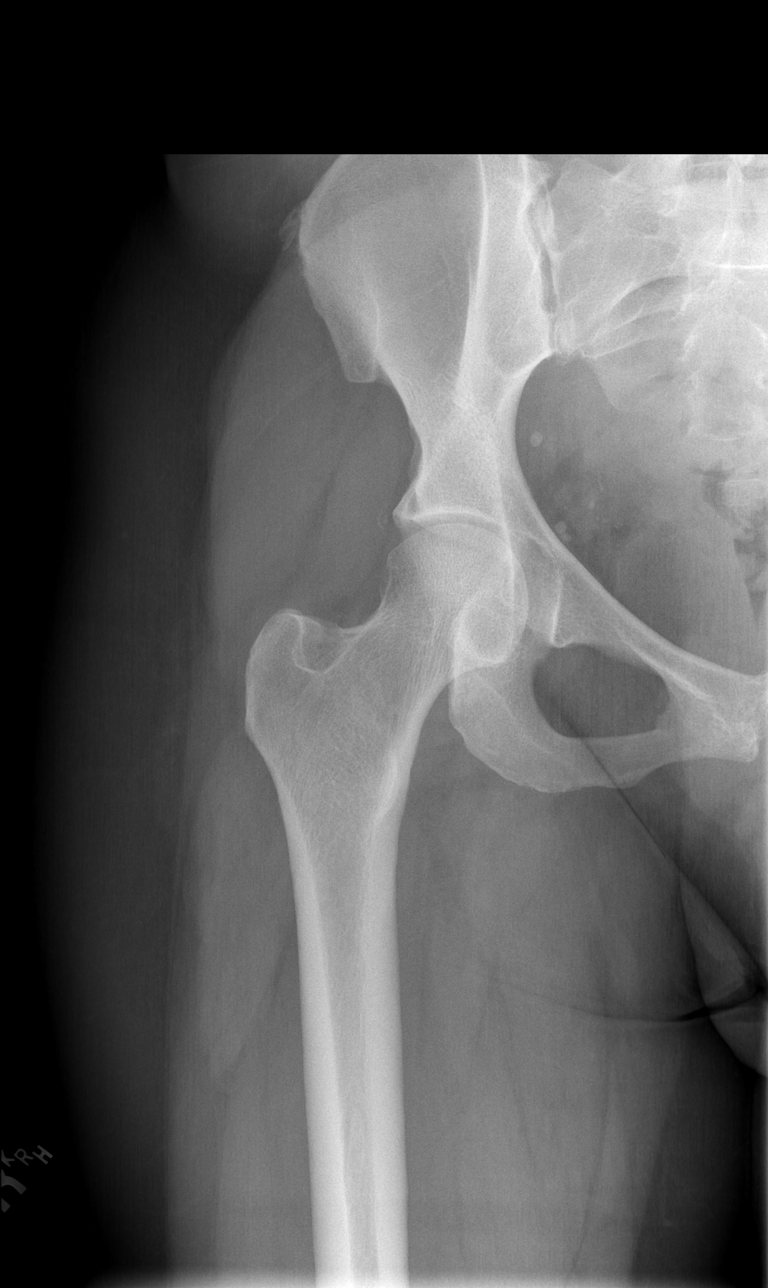

[t hip frog leg right]
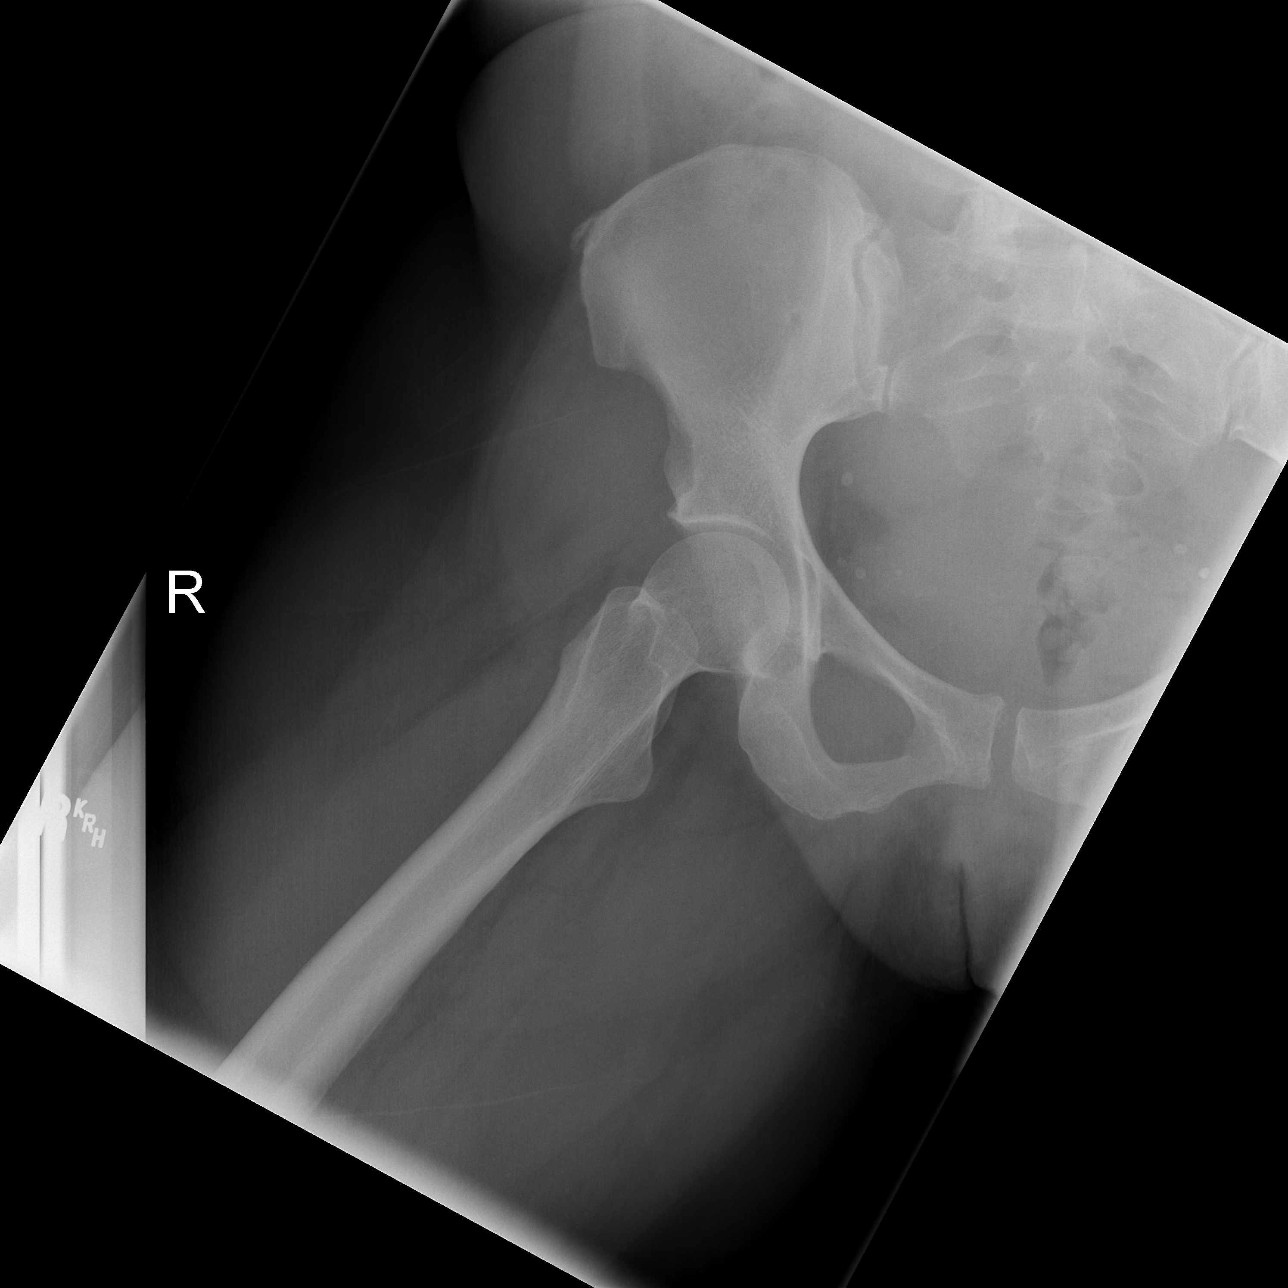

[3 of 3 positions shown; findings below may reference images not displayed]

FINDINGS: No acute fracture. No dislocation.  Unremarkable soft tissues.
IMPRESSION: No acute bony pathology.

## 2018-10-13 ENCOUNTER — Encounter (HOSPITAL_BASED_OUTPATIENT_CLINIC_OR_DEPARTMENT_OTHER): Payer: Self-pay | Admitting: *Deleted

## 2018-10-13 ENCOUNTER — Emergency Department (HOSPITAL_BASED_OUTPATIENT_CLINIC_OR_DEPARTMENT_OTHER)
Admission: EM | Admit: 2018-10-13 | Discharge: 2018-10-13 | Disposition: A | Discharge status: Other Acute Inpatient Hospital | Payer: Self-pay | Attending: Emergency Medicine | Admitting: Emergency Medicine

## 2018-10-13 ENCOUNTER — Emergency Department (HOSPITAL_BASED_OUTPATIENT_CLINIC_OR_DEPARTMENT_OTHER): Payer: Self-pay

## 2018-10-13 ENCOUNTER — Other Ambulatory Visit: Payer: Self-pay

## 2018-10-13 DIAGNOSIS — M5442 Lumbago with sciatica, left side: Secondary | ICD-10-CM | POA: Insufficient documentation

## 2018-10-13 DIAGNOSIS — R202 Paresthesia of skin: Secondary | ICD-10-CM | POA: Insufficient documentation

## 2018-10-13 DIAGNOSIS — R200 Anesthesia of skin: Secondary | ICD-10-CM

## 2018-10-13 DIAGNOSIS — I10 Essential (primary) hypertension: Secondary | ICD-10-CM | POA: Insufficient documentation

## 2018-10-13 DIAGNOSIS — R29898 Other symptoms and signs involving the musculoskeletal system: Secondary | ICD-10-CM

## 2018-10-13 DIAGNOSIS — R531 Weakness: Secondary | ICD-10-CM | POA: Insufficient documentation

## 2018-10-13 DIAGNOSIS — Z79899 Other long term (current) drug therapy: Secondary | ICD-10-CM | POA: Insufficient documentation

## 2018-10-13 LAB — COMPREHENSIVE METABOLIC PANEL
ALT: 12 U/L (ref 0–44)
AST: 18 U/L (ref 15–41)
Albumin: 3.8 g/dL (ref 3.5–5.0)
Alkaline Phosphatase: 46 U/L (ref 38–126)
Anion gap: 9 (ref 5–15)
BUN: 15 mg/dL (ref 6–20)
CO2: 23 mmol/L (ref 22–32)
Calcium: 9.1 mg/dL (ref 8.9–10.3)
Chloride: 103 mmol/L (ref 98–111)
Creatinine, Ser: 0.77 mg/dL (ref 0.44–1.00)
GFR calc Af Amer: >60 mL/min (ref >60)
GFR calc non Af Amer: >60 mL/min (ref >60)
Glucose, Bld: 98 mg/dL (ref 70–99)
Potassium: 3.9 mmol/L (ref 3.5–5.1)
Sodium: 135 mmol/L (ref 135–145)
Total Bilirubin: 0.3 mg/dL (ref 0.3–1.2)
Total Protein: 7.5 g/dL (ref 6.5–8.1)

## 2018-10-13 LAB — URINALYSIS, ROUTINE W REFLEX MICROSCOPIC
Bilirubin Urine: NEGATIVE
Glucose, UA: NEGATIVE mg/dL
Hgb urine dipstick: NEGATIVE
Ketones, ur: NEGATIVE mg/dL
Leukocytes,Ua: NEGATIVE
Nitrite: NEGATIVE
Protein, ur: NEGATIVE mg/dL
Specific Gravity, Urine: 1.025 (ref 1.005–1.030)
pH: 6.5 (ref 5.0–8.0)

## 2018-10-13 LAB — CBC WITH DIFFERENTIAL/PLATELET
Abs Immature Granulocytes: 0.03 10*3/uL (ref 0.00–0.07)
Basophils Absolute: 0 10*3/uL (ref 0.0–0.1)
Basophils Relative: 1 %
Eosinophils Absolute: 0.1 10*3/uL (ref 0.0–0.5)
Eosinophils Relative: 2 %
HCT: 38.8 % (ref 36.0–46.0)
Hemoglobin: 12.4 g/dL (ref 12.0–15.0)
Immature Granulocytes: 1 %
Lymphocytes Relative: 33 %
Lymphs Abs: 1.9 10*3/uL (ref 0.7–4.0)
MCH: 27.7 pg (ref 26.0–34.0)
MCHC: 32 g/dL (ref 30.0–36.0)
MCV: 86.8 fL (ref 80.0–100.0)
Monocytes Absolute: 0.4 10*3/uL (ref 0.1–1.0)
Monocytes Relative: 7 %
Neutro Abs: 3.2 10*3/uL (ref 1.7–7.7)
Neutrophils Relative %: 56 %
Platelets: 345 10*3/uL (ref 150–400)
RBC: 4.47 MIL/uL (ref 3.87–5.11)
RDW: 14 % (ref 11.5–15.5)
WBC: 5.7 10*3/uL (ref 4.0–10.5)
nRBC: 0 % (ref 0.0–0.2)

## 2018-10-13 LAB — MAGNESIUM: Magnesium: 2.1 mg/dL (ref 1.7–2.4)

## 2018-10-13 LAB — PREGNANCY, URINE: Preg Test, Ur: NEGATIVE

## 2018-10-13 NOTE — ED Notes (Signed)
Feet , legs thighs cramping off and on x months w some leg numbness

## 2018-10-13 NOTE — ED Provider Notes (Signed)
[facility] EMERGENCY DEPARTMENT Provider Note   CSN: [id number] Arrival date & time: 10/13/18  6962     History   Chief Complaint Chief Complaint  Patient presents with   Leg Pain    HPI Barbara Fox is a 48 y.o. female.     The history is provided by the patient and medical records. No language interpreter was used.  Leg Pain Location:  Leg Time since incident:  3 weeks Injury: no   Leg location:  L lower leg and L upper leg Pain details:    Quality:  Aching   Radiates to:  Back   Severity:  Severe   Onset quality:  Gradual   Duration:  3 weeks   Timing:  Constant   Progression:  Worsening Chronicity:  New Dislocation: no   Tetanus status:  Unknown Prior injury to area:  No Relieved by:  Nothing Worsened by:  Bearing weight Ineffective treatments:  None tried Associated symptoms: back pain, muscle weakness and numbness   Associated symptoms: no fatigue, no fever and no neck pain     Past Medical History:  Diagnosis Date   Hypertension     There are no active problems to display for this patient.   Past Surgical History:  Procedure Laterality Date   TUBAL LIGATION       OB History   No obstetric history on file.      Home Medications    Prior to Admission medications   Medication Sig Start Date End Date Taking? Authorizing Provider  cephALEXin (KEFLEX) 500 MG capsule Take 1 capsule (500 mg total) by mouth 2 (two) times daily. 04/06/18   Raylene Everts, MD  ibuprofen (ADVIL,MOTRIN) 800 MG tablet Take 1 tablet (800 mg total) by mouth every 8 (eight) hours as needed. 03/23/17   Long, Wonda Olds, MD  lisinopril-hydrochlorothiazide (PRINZIDE,ZESTORETIC) 10-12.5 MG tablet Take 1 tablet by mouth daily. 10/18/15   Malvin Johns, MD  potassium chloride (K-DUR) 10 MEQ tablet Take 2 tablets (20 mEq total) by mouth daily. 11/28/26   Delora Fuel, MD    Family History History reviewed. No pertinent family history.  Social History Social  History   Tobacco Use   Smoking status: Never Smoker   Smokeless tobacco: Never Used  Substance Use Topics   Alcohol use: Yes    Comment: socially   Drug use: No     Allergies   Shellfish allergy   Review of Systems Review of Systems  Constitutional: Negative for chills, diaphoresis, fatigue and fever.  HENT: Negative for congestion.   Respiratory: Negative for cough and shortness of breath.   Gastrointestinal: Negative for abdominal pain, constipation, diarrhea, nausea and vomiting.  Genitourinary: Negative for dysuria, flank pain and frequency.  Musculoskeletal: Positive for back pain. Negative for neck pain and neck stiffness.  Skin: Negative for rash and wound.  Neurological: Positive for weakness and numbness. Negative for light-headedness and headaches.  Psychiatric/Behavioral: Negative for agitation.  All other systems reviewed and are negative.    Physical Exam Updated Vital Signs BP 134/86 (BP Location: Right Arm)    Pulse 75    Temp 98.5 F (36.9 C) (Oral)    Resp 16    Ht 5\' 3"  (1.6 m)    Wt 79.4 kg    LMP 09/22/2018    SpO2 100%    BMI 31.00 kg/m   Physical Exam Vitals signs and nursing note reviewed.  Constitutional:      General: She is not  in acute distress.    Appearance: She is well-developed. She is not ill-appearing, toxic-appearing or diaphoretic.  HENT:     Head: Normocephalic and atraumatic.  Eyes:     Extraocular Movements: Extraocular movements intact.     Conjunctiva/sclera: Conjunctivae normal.     Pupils: Pupils are equal, round, and reactive to light.  Neck:     Musculoskeletal: Neck supple.  Cardiovascular:     Rate and Rhythm: Normal rate and regular rhythm.     Heart sounds: No murmur.  Pulmonary:     Effort: Pulmonary effort is normal. No respiratory distress.     Breath sounds: Normal breath sounds.  Abdominal:     Palpations: Abdomen is soft.     Tenderness: There is no abdominal tenderness. There is no right CVA  tenderness or left CVA tenderness.  Musculoskeletal:        General: Tenderness present.     Right lower leg: No edema.     Left lower leg: No edema.  Skin:    General: Skin is warm and dry.     Capillary Refill: Capillary refill takes less than 2 seconds.  Neurological:     Mental Status: She is alert and oriented to person, place, and time.     GCS: GCS eye subscore is 4. GCS verbal subscore is 5. GCS motor subscore is 6.     Cranial Nerves: No cranial nerve deficit, dysarthria or facial asymmetry.     Sensory: Sensory deficit present.     Motor: Weakness present.     Deep Tendon Reflexes: Reflexes abnormal.     Comments: Patient has weakness in left leg compared to right.  She has numbness in left leg compared to right.  She has hyperreflexia at the patellar joint compared to right.  No tenderness in the leg on my exam or tenderness of the hip.  Tenderness present in her left low back.  Psychiatric:        Mood and Affect: Mood normal.      ED Treatments / Results  Labs (all labs ordered are listed, but only abnormal results are displayed) Labs Reviewed  CBC WITH DIFFERENTIAL/PLATELET  COMPREHENSIVE METABOLIC PANEL  URINALYSIS, ROUTINE W REFLEX MICROSCOPIC  PREGNANCY, URINE  MAGNESIUM    EKG None  Radiology [place] Venous Img Lower Unilateral Left  Result Date: 10/13/2018 CLINICAL DATA:  Neck pain.  History of DVT. EXAM: LEFT LOWER EXTREMITY VENOUS DOPPLER ULTRASOUND TECHNIQUE: Gray-scale sonography with graded compression, as well as color Doppler and duplex ultrasound were performed to evaluate the lower extremity deep venous systems from the level of the common femoral vein and including the common femoral, femoral, profunda femoral, popliteal and calf veins including the posterior tibial, peroneal and gastrocnemius veins when visible. The superficial great saphenous vein was also interrogated. Spectral Doppler was utilized to evaluate flow at rest and with distal augmentation  maneuvers in the common femoral, femoral and popliteal veins. COMPARISON:  CT 07/29/2017. FINDINGS: Contralateral Common Femoral Vein: Respiratory phasicity is normal and symmetric with the symptomatic side. No evidence of thrombus. Normal compressibility. Common Femoral Vein: No evidence of thrombus. Normal compressibility, respiratory phasicity and response to augmentation. Saphenofemoral Junction: No evidence of thrombus. Normal compressibility and flow on color Doppler imaging. Profunda Femoral Vein: No evidence of thrombus. Normal compressibility and flow on color Doppler imaging. Femoral Vein: No evidence of thrombus. Normal compressibility, respiratory phasicity and response to augmentation. Popliteal Vein: No evidence of thrombus. Normal compressibility, respiratory phasicity and response  to augmentation. Calf Veins: No evidence of thrombus. Normal compressibility and flow on color Doppler imaging. Superficial Great Saphenous Vein: No evidence of thrombus. Normal compressibility. Other Findings:  None. IMPRESSION: No evidence of deep venous thrombosis. Electronically Signed   By: Maisie Fushomas  Register   On: 10/13/2018 12:19    Procedures Procedures (including critical care time)  Medications Ordered in ED Medications - No data to display   Initial Impression / Assessment and Plan / ED Course  I have reviewed the triage vital signs and the nursing notes.  Pertinent labs & imaging results that were available during my care of the patient were reviewed by me and considered in my medical decision making (see chart for details).        Barbara Fox is a 48 y.o. female with a past medical history significant for hypertension who presents with 3 weeks of left low back pain, left leg pain, left leg weakness.  Patient reports that she has a strong family history of DVT and is concerned that her left leg pain may be from a blood clot.  She reports some subjective swelling but denies persistent  swelling.  She has never had any injury to her back or leg in the past.  She reports that for the last 3 weeks she has been having worsened pain which she describes as up to a 20 out of 10 in her left leg and left low back.  She reports the pain is right now a 4 out of 10.  She reports the left leg goes to sleep and is completely numb at times and today feels somewhat numb.  She reports her left leg has been giving out on her which is new.  She denies any loss of control of her bowel or bladder.  She denies any abdominal pain.  No fevers, chills, chest pain, shortness of breath, nausea, vomiting.  She denies other complaints.  On exam, straight leg raise is positive on the left.  She has numbness and weakness on the left.  Patient is hyperreflexic at the left patellar reflex.  Patient had palpable DP pulses bilaterally that were symmetric.  Leg was nontender on my exam with no significant swelling.  No calf tenderness.  Lungs were clear and chest was nontender.  Abdomen was nontender.  Patient tenderness in her left low back.  Clinically I am most concerned about a radicular cause of her symptoms and a lumbar spine because of the numbness and weakness.  As she is very concerned about DVT with her strong family history with left leg pain, we agreed to get a lower leg ultrasound to rule out DVT.  We will also get labs to look for other abnormalities including electrolyte imbalance which may have caused muscle spasms or pain.  Anticipate share decision-making conversation to determine disposition as I suspect she will likely need MRI and transfer to look for a cauda equina type picture with her new leg symptoms and finding on exam with low back pain.  3:40 PM Patient's diagnostic work-up thus far was overall reassuring.  Her CBC and CMP unremarkable.  She is not pregnant.  Urinalysis shows no infection.  Her ultrasound showed no evidence of DVT.  Patient was reassessed and she still is complaining of the  pain, numbness, and weakness of the leg.  She is able to ambulate but due to these abnormalities, we feel it is reasonable to get an MRI to rule out cauda equina or other spinal abnormality.  Spoke with Dr. Erasmo ScoreMike Butler at [facility] who will except the patient in transfer.  She does not drive a stick shift vehicle and she does not use her left leg while driving thus, we feel she is safe to drive herself to the emergency department.  Anticipate reassessment after MRI to determine disposition.  If MRI is reassuring, anticipate discharge for outpatient PCP follow-up.   Final Clinical Impressions(s) / ED Diagnoses   Final diagnoses:  Left leg numbness  Left leg weakness  Acute left-sided low back pain with left-sided sciatica     Clinical Impression: 1. Left leg numbness   2. Left leg weakness   3. Acute left-sided low back pain with left-sided sciatica     Disposition: Care transferred to [facility] for MRI of the lumbar spine.  Patient accepted by Dr. Erasmo ScoreMike Butler.  This note was prepared with assistance of Conservation officer, historic buildingsDragon voice recognition software. Occasional wrong-word or sound-a-like substitutions may have occurred due to the inherent limitations of voice recognition software.      Barbara Fox, Barbara Brimhristopher J, MD 10/13/18 [phone]

## 2018-10-13 NOTE — ED Triage Notes (Signed)
Pt reports 3 months of intermittent swelling of her feet and legs. amb to room from triage with quick steady gait in nad. Pt denies any other c/o.

## 2018-11-26 ENCOUNTER — Emergency Department (HOSPITAL_COMMUNITY)
Admission: EM | Admit: 2018-11-26 | Discharge: 2018-11-26 | Disposition: A | Discharge status: Home / Self Care | Payer: Self-pay | Attending: Emergency Medicine | Admitting: Emergency Medicine

## 2018-11-26 ENCOUNTER — Encounter (HOSPITAL_COMMUNITY): Payer: Self-pay | Admitting: *Deleted

## 2018-11-26 ENCOUNTER — Other Ambulatory Visit: Payer: Self-pay

## 2018-11-26 DIAGNOSIS — S060X1A Concussion with loss of consciousness of 30 minutes or less, initial encounter: Secondary | ICD-10-CM | POA: Insufficient documentation

## 2018-11-26 DIAGNOSIS — I10 Essential (primary) hypertension: Secondary | ICD-10-CM | POA: Insufficient documentation

## 2018-11-26 DIAGNOSIS — Z79899 Other long term (current) drug therapy: Secondary | ICD-10-CM | POA: Insufficient documentation

## 2018-11-26 DIAGNOSIS — Y929 Unspecified place or not applicable: Secondary | ICD-10-CM | POA: Insufficient documentation

## 2018-11-26 DIAGNOSIS — Y939 Activity, unspecified: Secondary | ICD-10-CM | POA: Insufficient documentation

## 2018-11-26 DIAGNOSIS — Y999 Unspecified external cause status: Secondary | ICD-10-CM | POA: Insufficient documentation

## 2018-11-26 DIAGNOSIS — M26622 Arthralgia of left temporomandibular joint: Secondary | ICD-10-CM | POA: Insufficient documentation

## 2018-11-26 MED ORDER — NORTRIPTYLINE HCL 25 MG PO CAPS: 25.0000 mg | ORAL_CAPSULE | Freq: Every day | ORAL | 0 refills | Status: DC

## 2018-11-26 NOTE — Discharge Instructions (Signed)
Get help right away if: °You have severe or worsening headaches. °You have weakness or numbness in any part of your body. °You are confused. °Your coordination gets worse. °You vomit repeatedly. °You are sleepier than normal. °Your speech is slurred. °You cannot recognize people or places. °You have a seizure. °It is difficult to wake you up. °You have unusual behavior changes. °You have changes in your vision. °You lose consciousness. °
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
## 2018-11-26 NOTE — ED Notes (Signed)
Patient verbalizes understanding of discharge instructions. Opportunity for questioning and answers were provided. Armband removed by staff, pt discharged from ED.  
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
## 2019-04-02 ENCOUNTER — Encounter: Payer: Self-pay | Admitting: Family Medicine

## 2019-04-02 ENCOUNTER — Ambulatory Visit: Payer: Self-pay | Admitting: Family Medicine

## 2019-04-08 ENCOUNTER — Other Ambulatory Visit: Payer: Self-pay

## 2019-04-09 ENCOUNTER — Ambulatory Visit (INDEPENDENT_AMBULATORY_CARE_PROVIDER_SITE_OTHER): Payer: PRIVATE HEALTH INSURANCE | Admitting: Family Medicine

## 2019-04-09 ENCOUNTER — Encounter: Payer: Self-pay | Admitting: Family Medicine

## 2019-04-09 ENCOUNTER — Ambulatory Visit: Payer: Self-pay | Admitting: Family Medicine

## 2019-04-09 VITALS — Temp 98.2°F | Ht 63.0 in | Wt 187.0 lb

## 2019-04-09 DIAGNOSIS — F419 Anxiety disorder, unspecified: Secondary | ICD-10-CM

## 2019-04-09 DIAGNOSIS — I10 Essential (primary) hypertension: Secondary | ICD-10-CM | POA: Diagnosis not present

## 2019-04-09 DIAGNOSIS — Z7689 Persons encountering health services in other specified circumstances: Secondary | ICD-10-CM

## 2019-04-09 DIAGNOSIS — Z23 Encounter for immunization: Secondary | ICD-10-CM | POA: Diagnosis not present

## 2019-04-09 DIAGNOSIS — N760 Acute vaginitis: Secondary | ICD-10-CM | POA: Diagnosis not present

## 2019-04-09 DIAGNOSIS — B9689 Other specified bacterial agents as the cause of diseases classified elsewhere: Secondary | ICD-10-CM

## 2019-04-09 DIAGNOSIS — L80 Vitiligo: Secondary | ICD-10-CM

## 2019-04-09 MED ORDER — METRONIDAZOLE 500 MG PO TABS: 500.0000 mg | ORAL_TABLET | Freq: Two times a day (BID) | ORAL | 0 refills | Status: AC

## 2019-04-09 MED ORDER — LISINOPRIL-HYDROCHLOROTHIAZIDE 10-12.5 MG PO TABS: 1.0000 | ORAL_TABLET | Freq: Every day | ORAL | 3 refills | Status: AC

## 2019-04-09 MED ORDER — POTASSIUM CHLORIDE ER 10 MEQ PO TBCR
20.0000 meq | EXTENDED_RELEASE_TABLET | Freq: Every day | ORAL | 3 refills | Status: DC
Start: 2019-04-09 — End: 2019-04-09

## 2019-04-09 MED ORDER — POTASSIUM CHLORIDE ER 10 MEQ PO TBCR: 20.0000 meq | EXTENDED_RELEASE_TABLET | Freq: Every day | ORAL | 3 refills | Status: AC

## 2019-04-09 NOTE — Patient Instructions (Addendum)
Health Maintenance Due  Topic Date Due  . TETANUS/TDAP  09/22/1991  . PAP SMEAR-Modifier  09/21/1993  . INFLUENZA VACCINE  10/24/2018    No flowsheet data found.   [facility]  [email or web address] [address] [place] [address] P: [phone] F: [phone] [email or web address]  [facility] [email or web address] [address] [place], [place] [address] fax: [phone] [email or web address]  [facility]: [email or web address]  [facility] [email or web address]  [facility] [email or web address]  [facility] [email or web address]  [email or web address]

## 2019-04-09 NOTE — Progress Notes (Signed)
Barbara Fox is a 49 y.o. female  Chief Complaint  Patient presents with  . Establish Care    Pt c/o skin condition.  Pt would like a referral placed for OBGYN today.    HPI: Barbara Fox is a 49 y.o. female here to establish care with our office. She has not had a PCP in years. She is originally from [place]. 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  She would like flu vaccine today.  She has a h/o HTN and is on lisinopril-HCTZ 10-12.5mg  daily.   She requests referral to OB-GYN. Pt thinks she has BV - vaginal itching, odor, discharge x 1 week. No fever, chills, vaginal bleeding.  Pt notes small white spots on Lt wrist and RLE since birth. She notes multiple new spots in the past year. Asymptomatic. Individual size of dots have not increased.   Pt notes h/o MDD in 2015. She was on trintellix which was effective but she felt made her too calm. She was then switched to another med (unsure of name) but that made her too tired. She would like to establish care with [facility] counselor to discuss anxiety.  Past Medical History:  Diagnosis Date  . Hypertension     Past Surgical History:  Procedure Laterality Date  . TUBAL LIGATION      Social History   Socioeconomic History  . Marital status: Divorced    Spouse name: Not on file  . Number of children: Not on file  . Years of education: Not on file  . Highest education level: Not on file  Occupational History  . Not on file  Tobacco Use  . Smoking status: Never Smoker  . Smokeless tobacco: Never Used  Substance and Sexual Activity  . Alcohol use: Yes    Comment: socially  . Drug use: No  . Sexual activity: Yes    Birth control/protection: Surgical  Other Topics Concern  . Not on file  Social History Narrative  . Not on file   Social Determinants of Health   Financial Resource Strain:   . Difficulty of Paying Living Expenses: Not on file  Food Insecurity:   . Worried About Charity fundraiser in the Last  Year: Not on file  . Ran Out of Food in the Last Year: Not on file  Transportation Needs:   . Lack of Transportation (Medical): Not on file  . Lack of Transportation (Non-Medical): Not on file  Physical Activity:   . Days of Exercise per Week: Not on file  . Minutes of Exercise per Session: Not on file  Stress:   . Feeling of Stress : Not on file  Social Connections:   . Frequency of Communication with Friends and Family: Not on file  . Frequency of Social Gatherings with Friends and Family: Not on file  . Attends Religious Services: Not on file  . Active Member of Clubs or Organizations: Not on file  . Attends Archivist Meetings: Not on file  . Marital Status: Not on file  Intimate Partner Violence:   . Fear of Current or Ex-Partner: Not on file  . Emotionally Abused: Not on file  . Physically Abused: Not on file  . Sexually Abused: Not on file    Family History  Problem Relation Age of Onset  . Hypertension Mother   . Cancer Mother   . Hypertension Father       There is no immunization history on file for this patient.  Outpatient Encounter  Medications as of 04/09/2019  Medication Sig  . ibuprofen (ADVIL,MOTRIN) 800 MG tablet Take 1 tablet (800 mg total) by mouth every 8 (eight) hours as needed.  Marland Kitchen lisinopril-hydrochlorothiazide (PRINZIDE,ZESTORETIC) 10-12.5 MG tablet Take 1 tablet by mouth daily.  . potassium chloride (K-DUR) 10 MEQ tablet Take 2 tablets (20 mEq total) by mouth daily.  . cephALEXin (KEFLEX) 500 MG capsule Take 1 capsule (500 mg total) by mouth 2 (two) times daily. (Patient not taking: Reported on 04/09/2019)  . metroNIDAZOLE (FLAGYL) 500 MG tablet Take 1 tablet (500 mg total) by mouth 2 (two) times daily for 7 days.  . nortriptyline (PAMELOR) 25 MG capsule Take 1 capsule (25 mg total) by mouth at bedtime. For post concussion headaches (Patient not taking: Reported on 04/09/2019)   No facility-administered encounter medications on file as of  04/09/2019.     ROS: Pertinent positives and negatives noted in HPI. Remainder of ROS non-contributory    Allergies  Allergen Reactions  . Shellfish Allergy Anaphylaxis    Temp 98.2 F (36.8 C) (Temporal)   Ht 5\' 3"  (1.6 m)   Wt 187 lb (84.8 kg)   LMP 03/15/2019   BMI 33.13 kg/m   BP Readings from Last 3 Encounters:  11/26/18 (!) 150/93  10/13/18 113/68  04/06/18 132/88     Physical Exam  Constitutional: She is oriented to person, place, and time. She appears well-developed and well-nourished. No distress.  Genitourinary:    Genitourinary Comments: deferred   Neurological: She is alert and oriented to person, place, and time.  Skin: Skin is warm and dry.  Small, hypopigmented macules scattered on extremities and abdomen  Psychiatric: She has a normal mood and affect. Her behavior is normal.     A/P:  1. Encounter to establish care with new doctor - pt due for CPE, fasting labs - will schedule  2. BV (bacterial vaginosis) Rx: - metroNIDAZOLE (FLAGYL) 500 MG tablet; Take 1 tablet (500 mg total) by mouth 2 (two) times daily for 7 days.  Dispense: 14 tablet; Refill: 0 - f/u if symptoms worsen or do not improve in 7-10 days Discussed plan and reviewed medications with patient, including risks, benefits, and potential side effects. Pt expressed understand. All questions answered.  3. Vitiligo - Ambulatory referral to Dermatology  4. Essential hypertension - stable, controlled Refill: - lisinopril-hydrochlorothiazide (ZESTORETIC) 10-12.5 MG tablet; Take 1 tablet by mouth daily.  Dispense: 90 tablet; Refill: 3 - potassium chloride (KLOR-CON) 10 MEQ tablet; Take 2 tablets (20 mEq total) by mouth daily.  Dispense: 180 tablet; Refill: 3  5. Anxiety - Ambulatory referral to Psychology    This visit occurred during the SARS-CoV-2 public health emergency.  Safety protocols were in place, including screening questions prior to the visit, additional usage of staff PPE,  and extensive cleaning of exam room while observing appropriate contact time as indicated for disinfecting solutions.

## 2019-04-14 ENCOUNTER — Telehealth: Payer: Self-pay | Admitting: Family Medicine

## 2019-04-14 NOTE — Telephone Encounter (Signed)
Patient is calling and requesting to get a TB for work. Patient also needs a verification that she had the flu shot by Monday.  Please call at  [phone].

## 2019-04-15 NOTE — Telephone Encounter (Signed)
LDM, for pt to schedule a nurse visit to have TB performed.  I printed off flu injection and placed up front for pick up.

## 2019-04-20 ENCOUNTER — Other Ambulatory Visit: Payer: Self-pay

## 2019-04-20 ENCOUNTER — Ambulatory Visit (INDEPENDENT_AMBULATORY_CARE_PROVIDER_SITE_OTHER): Payer: PRIVATE HEALTH INSURANCE

## 2019-04-20 DIAGNOSIS — Z111 Encounter for screening for respiratory tuberculosis: Secondary | ICD-10-CM | POA: Diagnosis not present

## 2019-04-20 NOTE — Progress Notes (Signed)
Tuberculin skin test applied to Left ventral forearm/pt is scheduled to return for PPD read on 1.28.21/thx dmf

## 2019-04-22 ENCOUNTER — Other Ambulatory Visit: Payer: Self-pay

## 2019-04-22 ENCOUNTER — Ambulatory Visit: Payer: PRIVATE HEALTH INSURANCE

## 2019-04-22 DIAGNOSIS — Z111 Encounter for screening for respiratory tuberculosis: Secondary | ICD-10-CM

## 2019-04-22 LAB — TB SKIN TEST
Induration: 0 mm
TB Skin Test: NEGATIVE

## 2019-04-22 NOTE — Progress Notes (Signed)
PPD Reading Note  PPD read and results entered in EpicCare.  Result: 0 mm induration.  Interpretation: Negative  If test not read within 48-72 hours of initial placement, patient advised to repeat in other arm 1-3 weeks after this test.  Allergic reaction: no

## 2019-04-28 ENCOUNTER — Telehealth: Payer: Self-pay | Admitting: Family Medicine

## 2019-04-28 NOTE — Telephone Encounter (Signed)
Patient is calling and stated that she was referred to a dermatologist but the one she was referred to doesn't take her insurance. CB [phone].

## 2019-04-29 NOTE — Telephone Encounter (Signed)
I spoke with pt and informed her that if she could find out which offices will accept her insurance and let us know so that Dr. Salena Saner could send the referral to that office. Pt said that she was currently working on that.

## 2019-05-25 ENCOUNTER — Other Ambulatory Visit: Payer: Self-pay

## 2019-05-25 NOTE — Patient Instructions (Signed)
Health Maintenance Due  Topic Date Due  . TETANUS/TDAP  09/22/1991    Depression screen PHQ 2/9 04/09/2019  Decreased Interest 0  Down, Depressed, Hopeless 0  PHQ - 2 Score 0

## 2019-05-26 ENCOUNTER — Ambulatory Visit (INDEPENDENT_AMBULATORY_CARE_PROVIDER_SITE_OTHER): Payer: PRIVATE HEALTH INSURANCE | Admitting: Family Medicine

## 2019-05-26 ENCOUNTER — Encounter: Payer: Self-pay | Admitting: Family Medicine

## 2019-05-26 VITALS — BP 138/86 | HR 82 | Temp 98.4°F | Ht 63.0 in | Wt 184.8 lb

## 2019-05-26 DIAGNOSIS — F909 Attention-deficit hyperactivity disorder, unspecified type: Secondary | ICD-10-CM

## 2019-05-26 MED ORDER — AMPHETAMINE-DEXTROAMPHETAMINE 15 MG PO TABS: 15.0000 mg | ORAL_TABLET | Freq: Two times a day (BID) | ORAL | 0 refills | Status: AC

## 2019-05-26 NOTE — Progress Notes (Signed)
Barbara Fox is a 49 y.o. female  Chief Complaint  Patient presents with  . ADHD    Pt c/o that she can not focus and having trouble concentrating.    HPI: Barbara Fox is a 48 y.o. female here to discuss ADHD. Pt states she was diagnosed years ago, was on meds for some time, but stopped years ago (2014-2015). Recently notes more trouble focusing, concentrating, staying on task. Tasks take very long time to accomplish. Symptoms are getting in the way of her day to day functioning.  She is in school and is having trouble focusing in class.   Past Medical History:  Diagnosis Date  . Hypertension     Past Surgical History:  Procedure Laterality Date  . TUBAL LIGATION      Social History   Socioeconomic History  . Marital status: Divorced    Spouse name: Not on file  . Number of children: Not on file  . Years of education: Not on file  . Highest education level: Not on file  Occupational History  . Not on file  Tobacco Use  . Smoking status: Never Smoker  . Smokeless tobacco: Never Used  Substance and Sexual Activity  . Alcohol use: Yes    Comment: socially  . Drug use: No  . Sexual activity: Yes    Birth control/protection: Surgical  Other Topics Concern  . Not on file  Social History Narrative  . Not on file   Social Determinants of Health   Financial Resource Strain:   . Difficulty of Paying Living Expenses: Not on file  Food Insecurity:   . Worried About Programme researcher, broadcasting/film/video in the Last Year: Not on file  . Ran Out of Food in the Last Year: Not on file  Transportation Needs:   . Lack of Transportation (Medical): Not on file  . Lack of Transportation (Non-Medical): Not on file  Physical Activity:   . Days of Exercise per Week: Not on file  . Minutes of Exercise per Session: Not on file  Stress:   . Feeling of Stress : Not on file  Social Connections:   . Frequency of Communication with Friends and Family: Not on file  . Frequency of Social  Gatherings with Friends and Family: Not on file  . Attends Religious Services: Not on file  . Active Member of Clubs or Organizations: Not on file  . Attends Banker Meetings: Not on file  . Marital Status: Not on file  Intimate Partner Violence:   . Fear of Current or Ex-Partner: Not on file  . Emotionally Abused: Not on file  . Physically Abused: Not on file  . Sexually Abused: Not on file    Family History  Problem Relation Age of Onset  . Hypertension Mother   . Cancer Mother   . Hypertension Father      Immunization History  Administered Date(s) Administered  . Influenza,inj,Quad PF,6+ Mos 04/09/2019  . PPD Test 04/20/2019    Outpatient Encounter Medications as of 05/26/2019  Medication Sig  . ibuprofen (ADVIL,MOTRIN) 800 MG tablet Take 1 tablet (800 mg total) by mouth every 8 (eight) hours as needed.  Marland Kitchen lisinopril-hydrochlorothiazide (ZESTORETIC) 10-12.5 MG tablet Take 1 tablet by mouth daily.  . potassium chloride (KLOR-CON) 10 MEQ tablet Take 2 tablets (20 mEq total) by mouth daily.   No facility-administered encounter medications on file as of 05/26/2019.     ROS: Pertinent positives and negatives noted in HPI. Remainder of  ROS non-contributory     Allergies  Allergen Reactions  . Shellfish Allergy Anaphylaxis    BP 138/86 (BP Location: Left Arm, Patient Position: Sitting, Cuff Size: Normal)   Pulse 82   Temp 98.4 F (36.9 C) (Temporal)   Ht 5\' 3"  (1.6 m)   Wt 184 lb 12.8 oz (83.8 kg)   LMP 05/21/2019   SpO2 99%   BMI 32.74 kg/m   Physical Exam  Constitutional: She is oriented to person, place, and time. She appears well-developed and well-nourished. No distress.  Cardiovascular: Normal rate, regular rhythm and intact distal pulses.  Pulmonary/Chest: Effort normal. No respiratory distress.  Neurological: She is alert and oriented to person, place, and time. Coordination normal.  Psychiatric: She has a normal mood and affect.     A/P:   1. Attention deficit hyperactivity disorder (ADHD), unspecified ADHD type Rx: - amphetamine-dextroamphetamine (ADDERALL) 15 MG tablet; Take 1 tablet by mouth 2 (two) times daily.  Dispense: 60 tablet; Refill: 0 - pt to f/u in 3 wks or sooner PRN - VV or in-person  Discussed plan and reviewed medications with patient, including risks, benefits, and potential side effects. Pt expressed understand. All questions answered.    This visit occurred during the SARS-CoV-2 public health emergency.  Safety protocols were in place, including screening questions prior to the visit, additional usage of staff PPE, and extensive cleaning of exam room while observing appropriate contact time as indicated for disinfecting solutions.

## 2019-06-16 ENCOUNTER — Ambulatory Visit: Payer: PRIVATE HEALTH INSURANCE | Admitting: Family Medicine

## 2019-07-08 ENCOUNTER — Telehealth: Payer: Medicaid Other | Admitting: Family Medicine

## 2019-07-08 ENCOUNTER — Other Ambulatory Visit: Payer: Self-pay

## 2019-07-16 ENCOUNTER — Encounter: Payer: Self-pay | Admitting: Family Medicine

## 2019-07-16 ENCOUNTER — Telehealth: Payer: Medicaid Other | Admitting: Family Medicine

## 2019-07-16 ENCOUNTER — Other Ambulatory Visit: Payer: Self-pay

## 2019-07-16 VITALS — BP 134/70 | HR 88 | Temp 96.6°F | Ht 63.0 in | Wt 177.0 lb

## 2019-07-16 NOTE — Progress Notes (Signed)
Pt was unable to be reached by me via Careg[facility] (text sent 2x) or by phone (called 3x and LM x2). I was running about behind schedule but pt was made aware of this by CMA. Pt was not seen today.

## 2019-07-29 ENCOUNTER — Telehealth: Payer: Self-pay | Admitting: Family Medicine

## 2019-07-29 NOTE — Telephone Encounter (Signed)
Patient is calling and wanted to see if she can get a copy of her TB skin test. Pls advise. CB is [phone]

## 2019-07-29 NOTE — Telephone Encounter (Signed)
Informed pt that I have documents ready for her to pick up.

## 2019-12-13 IMAGING — US VENOUS DOPPLER ULTRASOUND OF LEFT LOWER EXTREMITY
1 series · 13 of 24 positions shown · non-contrast
Comparison: CT 07/29/2017.

CLINICAL DATA: Neck pain.  History of DVT.



[Series 1: venous doppler ultrasound of left lower extremity · 13 of 38 slices shown]
[im 1/38]
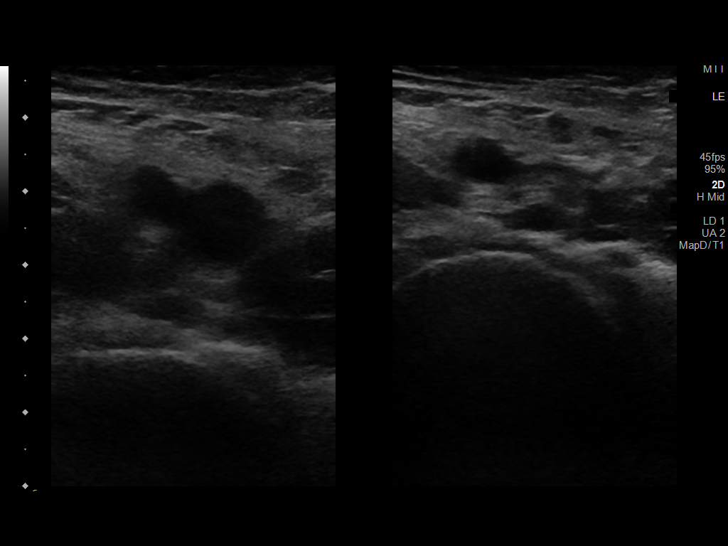
[im 4/38]
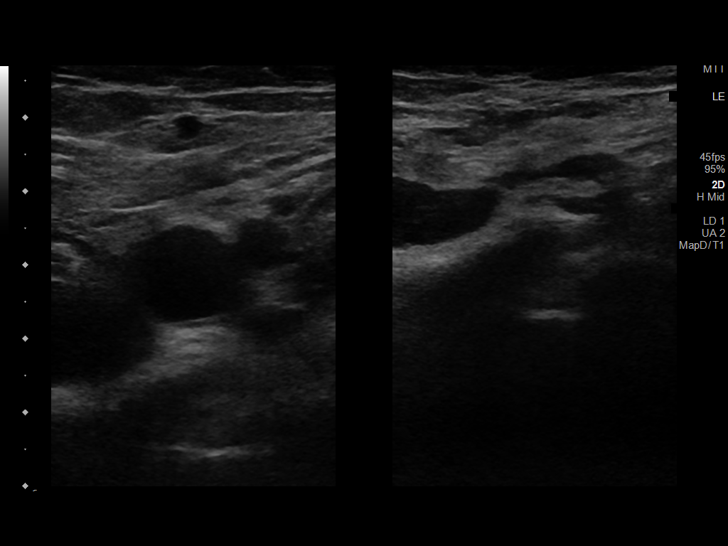
[im 7/38]
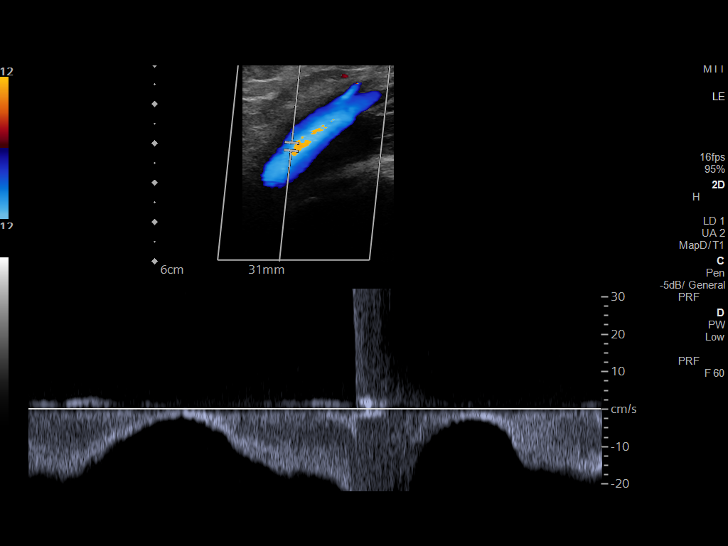
[im 10/38]
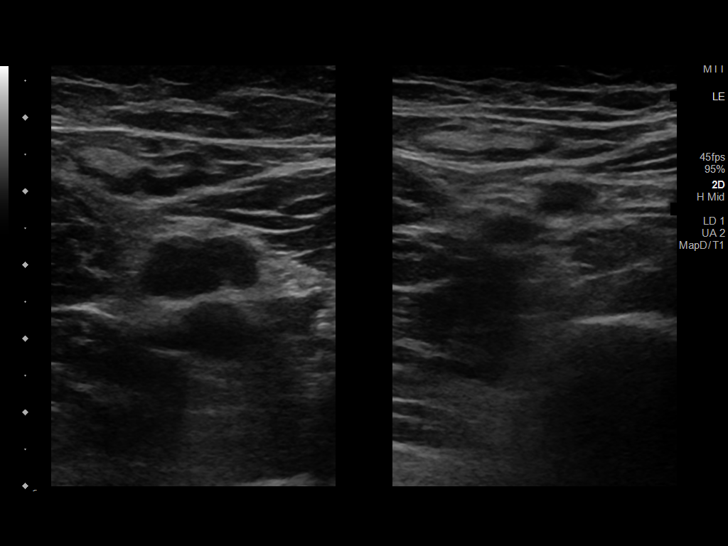
[im 13/38]
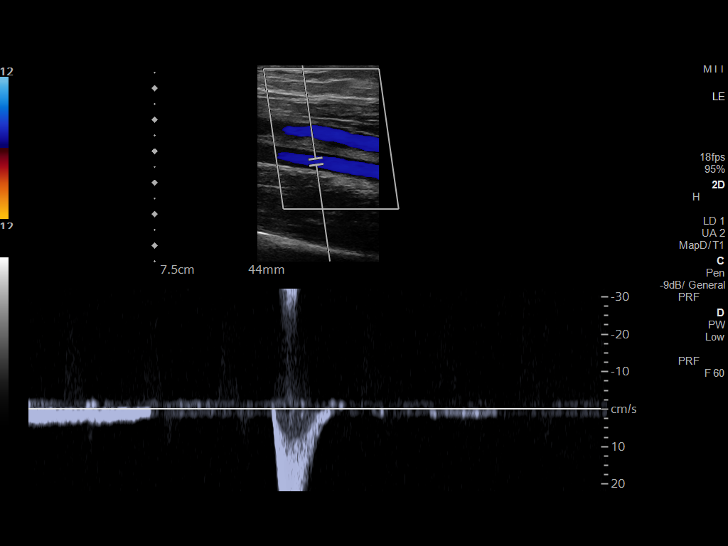
[im 17/38]
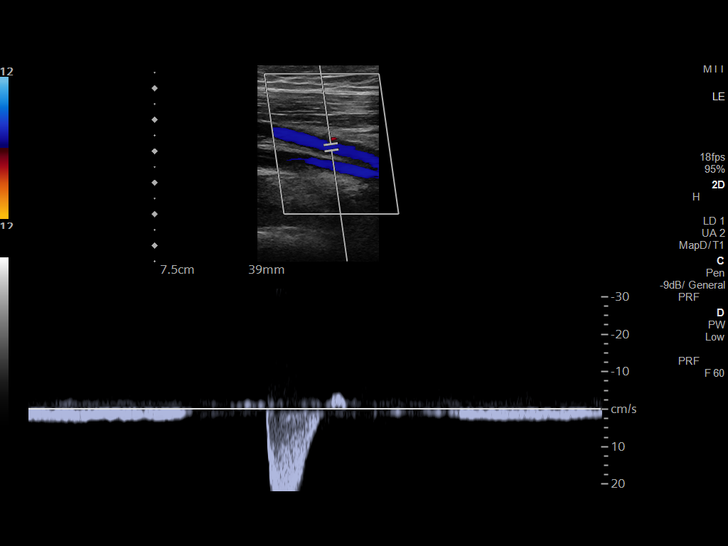
[im 20/38]
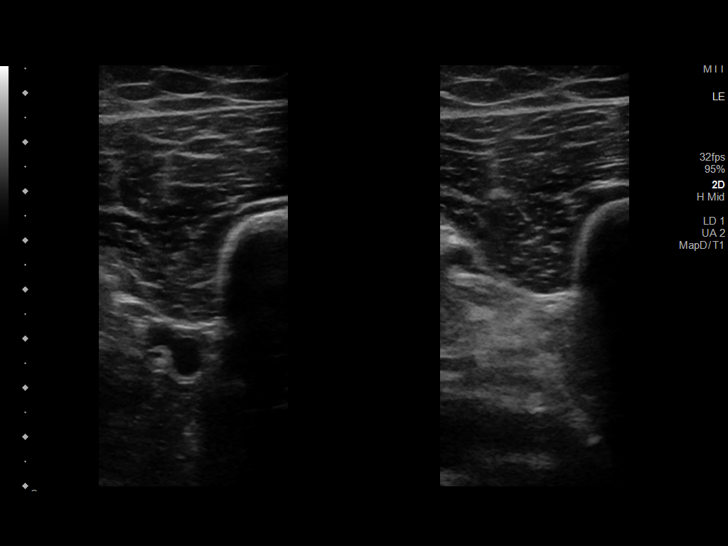
[im 21/38]
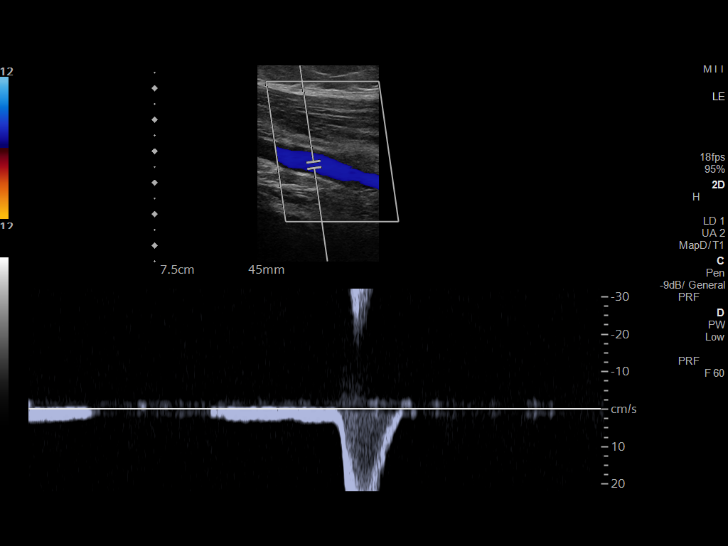
[im 25/38]
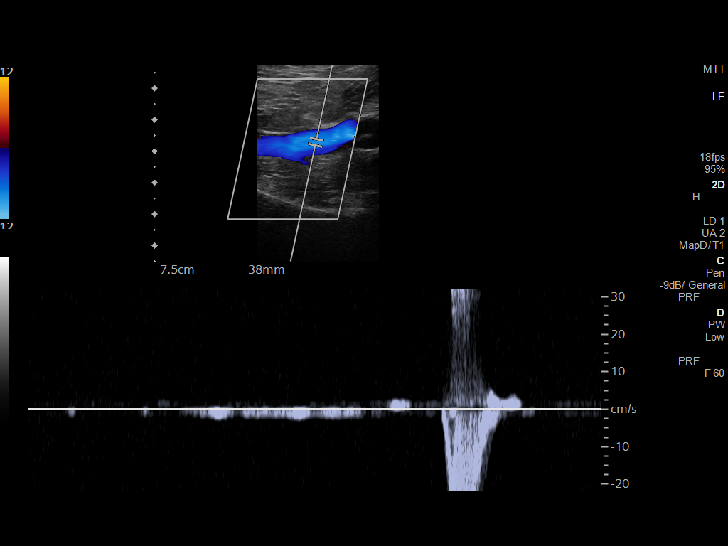
[im 28/38]
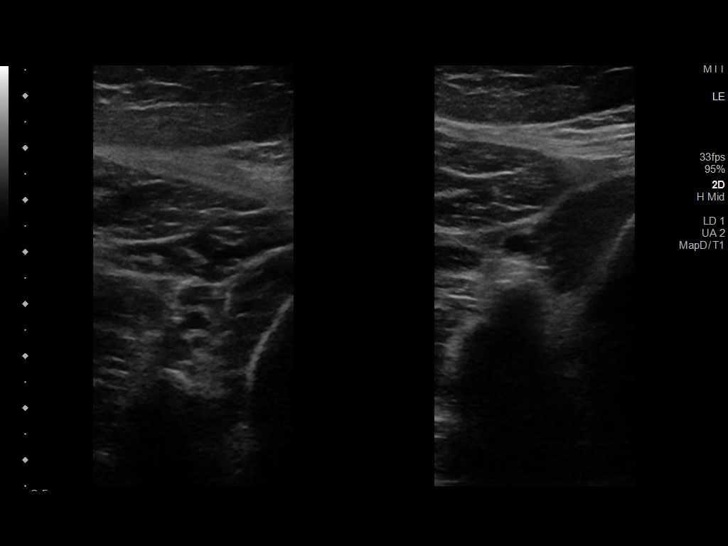
[im 31/38]
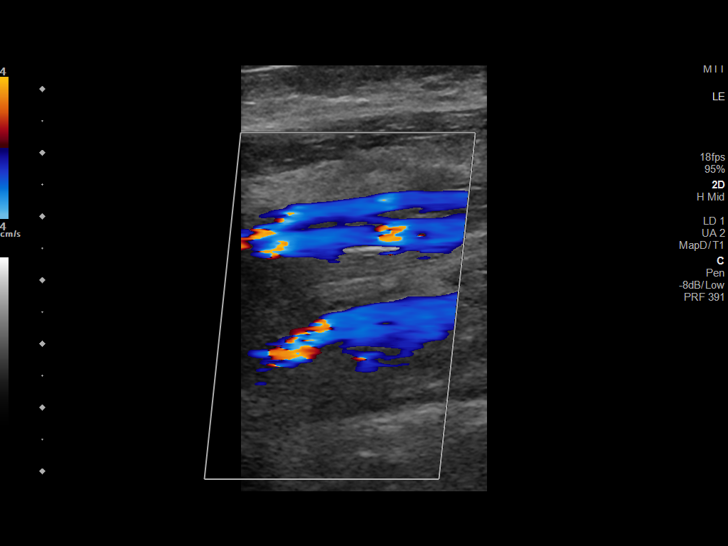
[im 34/38]
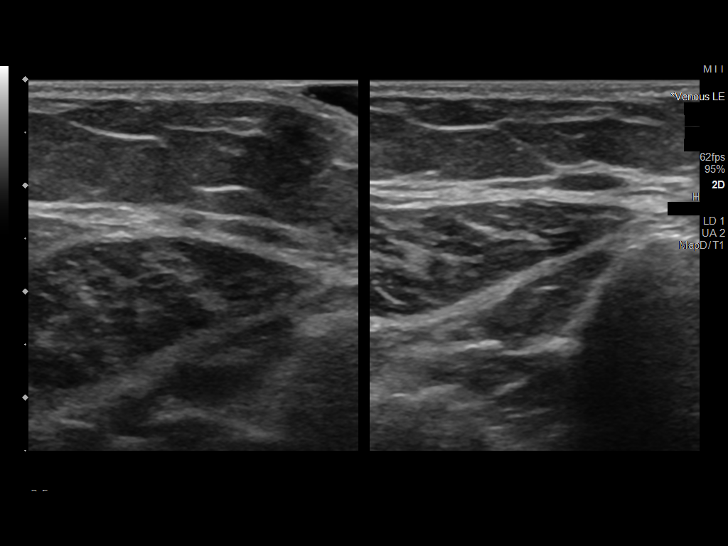
[im 38/38]
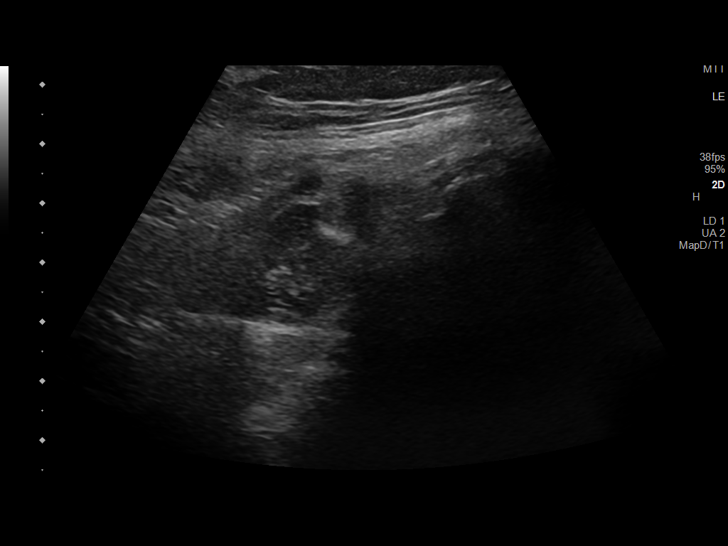

[13 of 24 positions shown; findings below may reference images not displayed]

FINDINGS: Contralateral Common Femoral Vein: Respiratory phasicity is normal
and symmetric with the symptomatic side. No evidence of thrombus.
Normal compressibility.

Common Femoral Vein: No evidence of thrombus. Normal
compressibility, respiratory phasicity and response to augmentation.

Saphenofemoral Junction: No evidence of thrombus. Normal
compressibility and flow on color Doppler imaging.

Profunda Femoral Vein: No evidence of thrombus. Normal
compressibility and flow on color Doppler imaging.

Femoral Vein: No evidence of thrombus. Normal compressibility,
respiratory phasicity and response to augmentation.

Popliteal Vein: No evidence of thrombus. Normal compressibility,
respiratory phasicity and response to augmentation.

Calf Veins: No evidence of thrombus. Normal compressibility and flow
on color Doppler imaging.

Superficial Great Saphenous Vein: No evidence of thrombus. Normal
compressibility.

Other Findings:  None.
IMPRESSION: No evidence of deep venous thrombosis.
